# Patient Record
Sex: Male | Born: 1970 | Race: White | Hispanic: No | Marital: Married | Smoking: Former smoker
Health system: Southern US, Community
[De-identification: ages and names within clinical notes are randomized; demographics above are authoritative.]

## PROBLEM LIST (undated history)

## (undated) DIAGNOSIS — K219 Gastro-esophageal reflux disease without esophagitis: Secondary | ICD-10-CM

## (undated) DIAGNOSIS — E785 Hyperlipidemia, unspecified: Secondary | ICD-10-CM

## (undated) DIAGNOSIS — K5790 Diverticulosis of intestine, part unspecified, without perforation or abscess without bleeding: Secondary | ICD-10-CM

## (undated) DIAGNOSIS — M1990 Unspecified osteoarthritis, unspecified site: Secondary | ICD-10-CM

## (undated) DIAGNOSIS — F419 Anxiety disorder, unspecified: Secondary | ICD-10-CM

## (undated) DIAGNOSIS — I10 Essential (primary) hypertension: Secondary | ICD-10-CM

## (undated) DIAGNOSIS — F32A Depression, unspecified: Secondary | ICD-10-CM

## (undated) DIAGNOSIS — E119 Type 2 diabetes mellitus without complications: Secondary | ICD-10-CM

## (undated) DIAGNOSIS — F329 Major depressive disorder, single episode, unspecified: Secondary | ICD-10-CM

## (undated) HISTORY — DX: Gastro-esophageal reflux disease without esophagitis: K21.9

## (undated) HISTORY — DX: Depression, unspecified: F32.A

## (undated) HISTORY — DX: Diverticulosis of intestine, part unspecified, without perforation or abscess without bleeding: K57.90

## (undated) HISTORY — DX: Essential (primary) hypertension: I10

## (undated) HISTORY — PX: BONY PELVIS SURGERY: SHX572

## (undated) HISTORY — DX: Unspecified osteoarthritis, unspecified site: M19.90

## (undated) HISTORY — DX: Anxiety disorder, unspecified: F41.9

## (undated) HISTORY — DX: Hyperlipidemia, unspecified: E78.5

---
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
## 2006-03-06 ENCOUNTER — Ambulatory Visit (HOSPITAL_COMMUNITY): Admission: RE | Admit: 2006-03-06 | Discharge: 2006-03-06 | Payer: Self-pay | Admitting: Family Medicine

## 2006-08-09 ENCOUNTER — Inpatient Hospital Stay (HOSPITAL_COMMUNITY): Admission: EM | Admit: 2006-08-09 | Discharge: 2006-08-18 | Payer: Self-pay | Admitting: Emergency Medicine

## 2006-08-10 ENCOUNTER — Ambulatory Visit: Payer: Self-pay | Admitting: Physical Medicine & Rehabilitation

## 2006-08-29 ENCOUNTER — Ambulatory Visit (HOSPITAL_COMMUNITY): Admission: RE | Admit: 2006-08-29 | Discharge: 2006-08-29 | Payer: Self-pay | Admitting: Specialist

## 2007-07-27 HISTORY — PX: HUMERUS FRACTURE SURGERY: SHX670

## 2011-09-19 ENCOUNTER — Other Ambulatory Visit: Payer: Self-pay | Admitting: Physician Assistant

## 2011-09-27 ENCOUNTER — Telehealth: Payer: Self-pay

## 2011-09-27 NOTE — Telephone Encounter (Signed)
Needs OV for more refills

## 2011-09-27 NOTE — Telephone Encounter (Signed)
Pt requests refill on klonopin (he thinks its that and not clonazepam) - states he's [facility] [address] for multiple days now. Pt is in [place] currently and states if [facility] [address] he can pick it up at the location in [place].   bf

## 2011-09-28 NOTE — Telephone Encounter (Signed)
Called both numbers on file and both said calls could not be completed as dialed. Tried to call with [phone] first and still did not go through.

## 2011-09-29 ENCOUNTER — Telehealth: Payer: Self-pay

## 2011-09-29 NOTE — Telephone Encounter (Signed)
Pt is checking on the status of his refills. He is out of town for three more weeks.

## 2011-09-29 NOTE — Telephone Encounter (Signed)
TC pt. Got same message as yesterday at both #s saying call could not be completed as dialed. Sent unable to reach letter telling pt he need OV for RFs

## 2011-10-01 MED ORDER — CLONAZEPAM 0.5 MG PO TABS: 0.5000 mg | ORAL_TABLET | Freq: Three times a day (TID) | ORAL | Status: DC | PRN

## 2011-10-01 NOTE — Telephone Encounter (Signed)
Spoke with patient and he is out of town working in [place]. He works an 18 day turn around and won't be back in town until around 10/16/11. He is requesting if we could refill just a couple weeks worth until he is back in town and come in and be see. He request call in to [facility] [address] adn he can have [facility].

## 2011-10-01 NOTE — Telephone Encounter (Signed)
Called in Rx as printed in Epic to [facility] and [facility] that his request was done and he can contact pharmacy

## 2011-10-01 NOTE — Telephone Encounter (Signed)
Signed for [facility] [address] - at TL desk

## 2011-10-23 ENCOUNTER — Other Ambulatory Visit: Payer: Self-pay | Admitting: Physician Assistant

## 2011-12-27 ENCOUNTER — Ambulatory Visit (INDEPENDENT_AMBULATORY_CARE_PROVIDER_SITE_OTHER): Payer: BC Managed Care – PPO | Admitting: Family Medicine

## 2011-12-27 VITALS — BP 136/85 | HR 74 | Temp 99.0°F | Resp 16 | Ht 71.0 in | Wt 284.6 lb

## 2011-12-27 DIAGNOSIS — J45909 Unspecified asthma, uncomplicated: Secondary | ICD-10-CM

## 2011-12-27 DIAGNOSIS — F411 Generalized anxiety disorder: Secondary | ICD-10-CM

## 2011-12-27 DIAGNOSIS — F419 Anxiety disorder, unspecified: Secondary | ICD-10-CM

## 2011-12-27 DIAGNOSIS — I10 Essential (primary) hypertension: Secondary | ICD-10-CM

## 2011-12-27 MED ORDER — CLONIDINE HCL 0.1 MG PO TABS: 0.1000 mg | ORAL_TABLET | Freq: Three times a day (TID) | ORAL | Status: DC

## 2011-12-27 MED ORDER — ALBUTEROL SULFATE HFA 108 (90 BASE) MCG/ACT IN AERS: 2.0000 | INHALATION_SPRAY | Freq: Four times a day (QID) | RESPIRATORY_TRACT | Status: DC | PRN

## 2011-12-27 MED ORDER — CLONAZEPAM 0.5 MG PO TABS: 0.5000 mg | ORAL_TABLET | Freq: Three times a day (TID) | ORAL | Status: DC | PRN

## 2011-12-27 NOTE — Patient Instructions (Signed)
Take Zyrtec(citrizine) one every night for allergic drainage. This should help to rest some also.  Used the clonidine one twice daily for blood pressure. This should help to have less symptoms as you come down on your oxycodone.

## 2011-12-27 NOTE — Progress Notes (Signed)
Subjective: Patient is here for several things. He needs his blood pressure rechecked. He is out of his medications.  His pain doctors continued him on oxycodone 10. He is down to just 3 a day, having down 7 and 1 point. However coming off of them is been very difficult for him. Its flulike symptoms when he just does not have his medication.  He has upper respiratory congestion and wheezing some. He was not able to give his last inhaler but this they had it brand only for some reason.  His nerves bothering him off lots DL. He has trouble sleeping at night. He uses the Klonopin and for that but has had some difficulty since he ran out of it.  Works out of town most time.  Objective no acute distress. Overweight white male, weight stays about the same ballpark. Throat clear. Neck supple without nodes. Chest clear. Heart regular without murmurs. He is congested in his head and sniffles some. Abdomen soft no masses tenderness.  Assessment: Hypertension Chronic pain syndrome with opioid dependence, gradually coming off Anxiety Sleep disturbance Asthma Allergic rhinitis  Plan: OTC Zyrtec at bedtime Use the Klonopin when needed. Prescribed clonoidine 0.1 bid in place of his previous blood pressure medicine to see if that will help him when he comes off the opioid.  Return in 3 months, early September.

## 2012-03-17 ENCOUNTER — Other Ambulatory Visit: Payer: Self-pay | Admitting: Pain Medicine

## 2012-03-17 DIAGNOSIS — M542 Cervicalgia: Secondary | ICD-10-CM

## 2012-03-17 DIAGNOSIS — M545 Low back pain: Secondary | ICD-10-CM

## 2012-03-20 ENCOUNTER — Other Ambulatory Visit: Payer: Self-pay

## 2012-07-11 ENCOUNTER — Ambulatory Visit (INDEPENDENT_AMBULATORY_CARE_PROVIDER_SITE_OTHER): Payer: BC Managed Care – PPO | Admitting: Family Medicine

## 2012-07-11 ENCOUNTER — Ambulatory Visit: Payer: BC Managed Care – PPO

## 2012-07-11 VITALS — BP 140/80 | HR 98 | Temp 102.0°F | Resp 18 | Ht 71.0 in | Wt 275.0 lb

## 2012-07-11 DIAGNOSIS — J45909 Unspecified asthma, uncomplicated: Secondary | ICD-10-CM

## 2012-07-11 DIAGNOSIS — R05 Cough: Secondary | ICD-10-CM

## 2012-07-11 DIAGNOSIS — F411 Generalized anxiety disorder: Secondary | ICD-10-CM

## 2012-07-11 DIAGNOSIS — F419 Anxiety disorder, unspecified: Secondary | ICD-10-CM

## 2012-07-11 DIAGNOSIS — R059 Cough, unspecified: Secondary | ICD-10-CM

## 2012-07-11 LAB — POCT CBC
Granulocyte percent: 78 %G (ref 37–80)
HCT, POC: 49.9 % (ref 43.5–53.7)
Hemoglobin: 15.6 g/dL (ref 14.1–18.1)
Lymph, poc: 1 (ref 0.6–3.4)
MCHC: 31.3 g/dL — AB (ref 31.8–35.4)
MPV: 9.5 fL (ref 0–99.8)
POC MID %: 7.2 %M (ref 0–12)
Platelet Count, POC: 245 10*3/uL (ref 142–424)
RBC: 5.21 M/uL (ref 4.69–6.13)

## 2012-07-11 MED ORDER — PREDNISONE 50 MG PO TABS: 50.0000 mg | ORAL_TABLET | Freq: Every day | ORAL | Status: DC

## 2012-07-11 MED ORDER — ALBUTEROL SULFATE HFA 108 (90 BASE) MCG/ACT IN AERS: 2.0000 | INHALATION_SPRAY | Freq: Four times a day (QID) | RESPIRATORY_TRACT | Status: DC | PRN

## 2012-07-11 MED ORDER — CLONAZEPAM 0.5 MG PO TABS: 0.5000 mg | ORAL_TABLET | Freq: Three times a day (TID) | ORAL | Status: DC | PRN

## 2012-07-11 MED ORDER — ALBUTEROL SULFATE (2.5 MG/3ML) 0.083% IN NEBU
2.5000 mg | INHALATION_SOLUTION | Freq: Once | RESPIRATORY_TRACT | Status: AC
  Administered 2012-07-11: 2.5 mg via RESPIRATORY_TRACT

## 2012-07-11 NOTE — Progress Notes (Signed)
Mark Ferrell is a 41 y.o. male who presents to [facility] today for cough, congestion, fever, chills, sweats, shortness of breath. This is been present for a few days. His cough is nonproductive. He has been using an albuterol inhaler occasionally which has not helped much.  He notes that there was a lot of smoke at work yesterday that might have set this breathing episode off recently off.  He denies any abdominal pain chest pains palpitations dysuria nausea vomiting or diarrhea.  He's tried some cold medicine which is not helped much.    Additionally he notes that he has run out of his chronic Klonopin for which he uses for anxiety.  He typically gets a 90 day supply and has been out for a while.  He feels slightly jittery of the last several days. He denies any SI/HI.   PMH: Reviewed anxiety, hypertension History  Substance Use Topics  . Smoking status: Never Smoker   . Smokeless tobacco: Not on file  . Alcohol Use: No   ROS as above  Medications reviewed. Current Outpatient Prescriptions  Medication Sig Dispense Refill  . albuterol (PROVENTIL HFA;VENTOLIN HFA) 108 (90 BASE) MCG/ACT inhaler Inhale 2 puffs into the lungs every 6 (six) hours as needed for wheezing.  1 Inhaler  5  . clonazePAM (KLONOPIN) 0.5 MG tablet Take 1 tablet (0.5 mg total) by mouth 3 (three) times daily as needed.  90 tablet  2  . oxyCODONE-acetaminophen (PERCOCET) 10-325 MG per tablet Take 1 tablet by mouth every 4 (four) hours as needed.        Exam:  BP 140/80  Pulse 98  Temp 102 F (38.9 C) (Oral)  Resp 18  Ht 5\' 11"  (1.803 m)  Wt 275 lb (124.739 kg)  BMI 38.35 kg/m2  SpO2 94% Gen: Well NAD HEENT: EOMI,  MMM Lungs: Increased worker breathing with expiratory wheezing.   Heart: RRR no MRG Abd: NABS, NT, ND Exts: Non edematous BL  LE, warm and well perfused.   Following albuterol treatment: Lung exam showed decreased wheezing and better air movement  Results for orders placed in visit on 07/11/12 (from the  past 72 hour(s))  POCT CBC     Status: Abnormal   Collection Time   07/11/12  6:54 PM      Component Value Range Comment   WBC 6.6  4.6 - 10.2 K/uL    Lymph, poc 1.0  0.6 - 3.4    POC LYMPH PERCENT 14.8  10 - 50 %L    MID (cbc) 0.5  0 - 0.9    POC MID % 7.2  0 - 12 %M    POC Granulocyte 5.1  2 - 6.9    Granulocyte percent 78.0  37 - 80 %G    RBC 5.21  4.69 - 6.13 M/uL    Hemoglobin 15.6  14.1 - 18.1 g/dL    HCT, POC 16.1  09.6 - 53.7 %    MCV 95.7  80 - 97 fL    MCH, POC 29.9  27 - 31.2 pg    MCHC 31.3 (*) 31.8 - 35.4 g/dL    RDW, POC 04.5      Platelet Count, POC 245  142 - 424 K/uL    MPV 9.5  0 - 99.8 fL    Two-view chest x-ray shows no infiltrates or acute changes  Assessment and Plan: 41 y.o. male with  1) asthma exacerbation. No signs of pneumonia today.  Exam improved following albuterol.  Plan: Prednisone and albuterol inhaler Followup on Friday if not improved Discussed warning signs or symptoms. Please see discharge instructions. Patient expresses understanding.  2) anxiety: Agree to prescribe 3 months of Klonopin. Discussed the case with Dr. Alwyn Ren who agrees.  Followup as needed

## 2012-07-11 NOTE — Patient Instructions (Addendum)
Thank you for coming in today. Call or go to the emergency room if you get worse, have trouble breathing, have chest pains, or palpitations.  If you are not better by Friday please come back.

## 2012-07-12 NOTE — Progress Notes (Signed)
Discussed management and agree.

## 2012-07-13 ENCOUNTER — Telehealth: Payer: Self-pay

## 2012-07-13 MED ORDER — BENZONATATE 100 MG PO CAPS: 100.0000 mg | ORAL_CAPSULE | Freq: Three times a day (TID) | ORAL | Status: DC | PRN

## 2012-07-13 NOTE — Telephone Encounter (Signed)
I have sent Mark Ferrell to the pharmacy.  Per Dr. Zollie Pee note drom 07/11/12, pt to follow up on Friday 12/20 if not improved.

## 2012-07-13 NOTE — Telephone Encounter (Signed)
Requesting cough medication.  [facility] on [address]  OTC is not working.  Seen Last night.  [phone]

## 2012-07-14 NOTE — Telephone Encounter (Signed)
Thanks I have called patient to advise.  

## 2012-08-05 ENCOUNTER — Other Ambulatory Visit: Payer: Self-pay

## 2012-08-24 ENCOUNTER — Other Ambulatory Visit: Payer: Self-pay | Admitting: Pain Medicine

## 2012-09-02 ENCOUNTER — Ambulatory Visit
Admission: RE | Admit: 2012-09-02 | Discharge: 2012-09-02 | Disposition: A | Discharge status: Home / Self Care | Payer: PRIVATE HEALTH INSURANCE | Source: Ambulatory Visit | Attending: Pain Medicine | Admitting: Pain Medicine

## 2012-09-02 DIAGNOSIS — M545 Low back pain: Secondary | ICD-10-CM

## 2012-09-02 DIAGNOSIS — M542 Cervicalgia: Secondary | ICD-10-CM

## 2013-05-13 ENCOUNTER — Ambulatory Visit: Payer: Self-pay | Admitting: Emergency Medicine

## 2013-05-13 VITALS — BP 124/82 | HR 84 | Temp 98.8°F | Resp 16 | Ht 72.0 in | Wt 271.0 lb

## 2013-05-13 DIAGNOSIS — Z0289 Encounter for other administrative examinations: Secondary | ICD-10-CM

## 2013-05-13 NOTE — Progress Notes (Signed)
[facility] [address] [place] [address] [phone]  Date:  05/13/2013   Name:  Mark Ferrell   DOB:  Jul 30, 1970   MRN:  [id number]  PCP:  Janace Hoard, MD    Chief Complaint: Employment Physical   History of Present Illness:  Mark Ferrell is a 42 y.o. very pleasant male patient who presents with the following:  DOT certification   Patient Active Problem List   Diagnosis Date Noted  . Cough 07/11/2012  . Anxiety 07/11/2012    No past medical history on file.  No past surgical history on file.  History  Substance Use Topics  . Smoking status: Never Smoker   . Smokeless tobacco: Not on file  . Alcohol Use: No    No family history on file.  Allergies  Allergen Reactions  . Penicillins Swelling    Medication list has been reviewed and updated.  Current Outpatient Prescriptions on File Prior to Visit  Medication Sig Dispense Refill  . albuterol (PROVENTIL HFA;VENTOLIN HFA) 108 (90 BASE) MCG/ACT inhaler Inhale 2 puffs into the lungs every 6 (six) hours as needed for wheezing.  1 Inhaler  5  . benzonatate (TESSALON) 100 MG capsule Take 1-2 capsules (100-200 mg total) by mouth 3 (three) times daily as needed for cough.  40 capsule  0  . clonazePAM (KLONOPIN) 0.5 MG tablet Take 1 tablet (0.5 mg total) by mouth 3 (three) times daily as needed.  90 tablet  0  . oxyCODONE-acetaminophen (PERCOCET) 10-325 MG per tablet Take 1 tablet by mouth every 4 (four) hours as needed.      . predniSONE (DELTASONE) 50 MG tablet Take 1 tablet (50 mg total) by mouth daily.  5 tablet  0   No current facility-administered medications on file prior to visit.    Review of Systems:  As per HPI, otherwise negative.    Physical Examination: Filed Vitals:   05/13/13 1417  BP: 124/82  Pulse: 84  Temp: 98.8 F (37.1 C)  Resp: 16   Filed Vitals:   05/13/13 1417  Height: 6' (1.829 m)  Weight: 271 lb (122.925 kg)   Body mass index is 36.75  kg/(m^2). Ideal Body Weight: Weight in (lb) to have BMI = 25: 183.9  GEN: WDWN, NAD, Non-toxic, A & O x 3 HEENT: Atraumatic, Normocephalic. Neck supple. No masses, No LAD. Ears and Nose: No external deformity. CV: RRR, No M/G/R. No JVD. No thrill. No extra heart sounds. PULM: CTA B, no wheezes, crackles, rhonchi. No retractions. No resp. distress. No accessory muscle use. ABD: S, NT, ND, +BS. No rebound. No HSM. EXTR: No c/c/e NEURO Normal gait.  PSYCH: Normally interactive. Conversant. Not depressed or anxious appearing.  Calm demeanor.    Assessment and Plan: DOT certification   Signed,  Phillips Odor, MD

## 2013-05-15 ENCOUNTER — Encounter: Payer: Self-pay | Admitting: Emergency Medicine

## 2013-07-12 ENCOUNTER — Emergency Department (HOSPITAL_COMMUNITY)
Admission: EM | Admit: 2013-07-12 | Discharge: 2013-07-12 | Disposition: A | Discharge status: Home / Self Care | Payer: BC Managed Care – PPO | Attending: Emergency Medicine | Admitting: Emergency Medicine

## 2013-07-12 ENCOUNTER — Encounter (HOSPITAL_COMMUNITY): Payer: Self-pay | Admitting: Emergency Medicine

## 2013-07-12 ENCOUNTER — Emergency Department (HOSPITAL_COMMUNITY): Payer: BC Managed Care – PPO

## 2013-07-12 DIAGNOSIS — M25569 Pain in unspecified knee: Secondary | ICD-10-CM | POA: Insufficient documentation

## 2013-07-12 DIAGNOSIS — Y9389 Activity, other specified: Secondary | ICD-10-CM | POA: Insufficient documentation

## 2013-07-12 DIAGNOSIS — Z79899 Other long term (current) drug therapy: Secondary | ICD-10-CM | POA: Insufficient documentation

## 2013-07-12 DIAGNOSIS — M545 Low back pain, unspecified: Secondary | ICD-10-CM | POA: Insufficient documentation

## 2013-07-12 DIAGNOSIS — M25559 Pain in unspecified hip: Secondary | ICD-10-CM | POA: Insufficient documentation

## 2013-07-12 DIAGNOSIS — G8929 Other chronic pain: Secondary | ICD-10-CM | POA: Insufficient documentation

## 2013-07-12 DIAGNOSIS — Z880 Allergy status to penicillin: Secondary | ICD-10-CM | POA: Insufficient documentation

## 2013-07-12 DIAGNOSIS — Y92410 Unspecified street and highway as the place of occurrence of the external cause: Secondary | ICD-10-CM | POA: Insufficient documentation

## 2013-07-12 DIAGNOSIS — M25579 Pain in unspecified ankle and joints of unspecified foot: Secondary | ICD-10-CM | POA: Insufficient documentation

## 2013-07-12 DIAGNOSIS — R209 Unspecified disturbances of skin sensation: Secondary | ICD-10-CM | POA: Insufficient documentation

## 2013-07-12 MED ORDER — OXYCODONE-ACETAMINOPHEN 5-325 MG PO TABS
2.0000 | ORAL_TABLET | Freq: Once | ORAL | Status: AC
  Administered 2013-07-12: 2 via ORAL
  Filled 2013-07-12: qty 2

## 2013-07-12 MED ORDER — METHOCARBAMOL 500 MG PO TABS: 500.0000 mg | ORAL_TABLET | Freq: Two times a day (BID) | ORAL | Status: DC

## 2013-07-12 MED ORDER — PREDNISONE 20 MG PO TABS: ORAL_TABLET | ORAL | Status: DC
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
## 2013-07-12 NOTE — ED Notes (Signed)
Patient transported to CT 

## 2013-07-20 NOTE — ED Provider Notes (Signed)
Medical screening examination/treatment/procedure(s) were performed by non-physician practitioner and as supervising physician I was immediately available for consultation/collaboration.  EKG Interpretation   None        Taite Baldassari, MD 07/20/13 1711 

## 2014-12-05 ENCOUNTER — Ambulatory Visit (INDEPENDENT_AMBULATORY_CARE_PROVIDER_SITE_OTHER): Payer: PRIVATE HEALTH INSURANCE | Admitting: Physician Assistant

## 2014-12-05 VITALS — BP 130/80 | HR 80 | Temp 98.5°F | Resp 20 | Ht 72.0 in | Wt 269.5 lb

## 2014-12-05 DIAGNOSIS — F1190 Opioid use, unspecified, uncomplicated: Secondary | ICD-10-CM | POA: Diagnosis not present

## 2014-12-05 LAB — COMPLETE METABOLIC PANEL WITH GFR
ALT: 12 U/L (ref 0–53)
AST: 14 U/L (ref 0–37)
Albumin: 4 g/dL (ref 3.5–5.2)
Alkaline Phosphatase: 45 U/L (ref 39–117)
BUN: 11 mg/dL (ref 6–23)
CALCIUM: 9.3 mg/dL (ref 8.4–10.5)
CO2: 26 mEq/L (ref 19–32)
CREATININE: 0.79 mg/dL (ref 0.50–1.35)
Chloride: 102 mEq/L (ref 96–112)
GFR, Est Non African American: >89 mL/min
Glucose, Bld: 112 mg/dL — ABNORMAL HIGH (ref 70–99)
POTASSIUM: 4.7 meq/L (ref 3.5–5.3)
SODIUM: 138 meq/L (ref 135–145)
Total Bilirubin: 0.8 mg/dL (ref 0.2–1.2)
Total Protein: 6.7 g/dL (ref 6.0–8.3)

## 2014-12-05 LAB — CBC
HEMATOCRIT: 44.4 % (ref 39.0–52.0)
Hemoglobin: 15 g/dL (ref 13.0–17.0)
MCH: 30.3 pg (ref 26.0–34.0)
MCHC: 33.8 g/dL (ref 30.0–36.0)
MCV: 89.7 fL (ref 78.0–100.0)
MPV: 10.3 fL (ref 8.6–12.4)
Platelets: 260 10*3/uL (ref 150–400)
RBC: 4.95 MIL/uL (ref 4.22–5.81)
RDW: 13 % (ref 11.5–15.5)
WBC: 8.7 10*3/uL (ref 4.0–10.5)

## 2014-12-05 MED ORDER — ALBUTEROL SULFATE HFA 108 (90 BASE) MCG/ACT IN AERS: 2.0000 | INHALATION_SPRAY | Freq: Four times a day (QID) | RESPIRATORY_TRACT | Status: DC | PRN
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
## 2014-12-06 ENCOUNTER — Encounter: Payer: Self-pay | Admitting: Physician Assistant

## 2014-12-06 NOTE — Progress Notes (Signed)
  Medical screening examination/treatment/procedure(s) were performed by non-physician practitioner and as supervising physician I was immediately available for consultation/collaboration.     

## 2014-12-12 ENCOUNTER — Telehealth: Payer: Self-pay

## 2014-12-12 NOTE — Telephone Encounter (Signed)
MR- Can you please fax this patient's lab to [facility] attn Prince Solian. Their fax number is not on their website and I tried calling multiple times and I kept getting voicemails.  Thanks so very much!!

## 2014-12-16 NOTE — Telephone Encounter (Signed)
Labs sent via fax to [facility].

## 2015-07-07 ENCOUNTER — Emergency Department (HOSPITAL_COMMUNITY): Payer: PRIVATE HEALTH INSURANCE

## 2015-07-07 ENCOUNTER — Emergency Department (HOSPITAL_COMMUNITY)
Admission: EM | Admit: 2015-07-07 | Discharge: 2015-07-07 | Discharge status: Against Medical Advice | Payer: PRIVATE HEALTH INSURANCE | Attending: Emergency Medicine | Admitting: Emergency Medicine

## 2015-07-07 ENCOUNTER — Encounter (HOSPITAL_COMMUNITY): Payer: Self-pay | Admitting: Family Medicine

## 2015-07-07 DIAGNOSIS — I10 Essential (primary) hypertension: Secondary | ICD-10-CM | POA: Insufficient documentation

## 2015-07-07 DIAGNOSIS — Z87891 Personal history of nicotine dependence: Secondary | ICD-10-CM | POA: Diagnosis not present

## 2015-07-07 DIAGNOSIS — R0602 Shortness of breath: Secondary | ICD-10-CM | POA: Diagnosis not present

## 2015-07-07 DIAGNOSIS — R062 Wheezing: Secondary | ICD-10-CM | POA: Diagnosis not present

## 2015-07-07 LAB — BRAIN NATRIURETIC PEPTIDE: B Natriuretic Peptide: 29.7 pg/mL (ref 0.0–100.0)

## 2015-07-07 LAB — BASIC METABOLIC PANEL
ANION GAP: 7 (ref 5–15)
BUN: 15 mg/dL (ref 6–20)
CO2: 29 mmol/L (ref 22–32)
CREATININE: 0.97 mg/dL (ref 0.61–1.24)
Calcium: 8.7 mg/dL — ABNORMAL LOW (ref 8.9–10.3)
Chloride: 104 mmol/L (ref 101–111)
GLUCOSE: 127 mg/dL — AB (ref 65–99)
Potassium: 4.2 mmol/L (ref 3.5–5.1)
Sodium: 140 mmol/L (ref 135–145)

## 2015-07-07 LAB — I-STAT TROPONIN, ED: TROPONIN I, POC: 0 ng/mL (ref 0.00–0.08)

## 2015-07-07 LAB — CBC
HCT: 44.1 % (ref 39.0–52.0)
Hemoglobin: 14.6 g/dL (ref 13.0–17.0)
MCH: 30.2 pg (ref 26.0–34.0)
MCHC: 33.1 g/dL (ref 30.0–36.0)
MCV: 91.3 fL (ref 78.0–100.0)
PLATELETS: 255 10*3/uL (ref 150–400)
RBC: 4.83 MIL/uL (ref 4.22–5.81)
RDW: 12.3 % (ref 11.5–15.5)
WBC: 8.1 10*3/uL (ref 4.0–10.5)

## 2015-07-07 MED ORDER — ALBUTEROL SULFATE (2.5 MG/3ML) 0.083% IN NEBU
INHALATION_SOLUTION | RESPIRATORY_TRACT | Status: AC
  Filled 2015-07-07: qty 3

## 2015-07-07 MED ORDER — ALBUTEROL SULFATE (2.5 MG/3ML) 0.083% IN NEBU
5.0000 mg | INHALATION_SOLUTION | Freq: Once | RESPIRATORY_TRACT | Status: AC
  Administered 2015-07-07: 5 mg via RESPIRATORY_TRACT

## 2015-07-07 NOTE — ED Notes (Signed)
Pt here for increased SOB, wheezing. sts SOB with minimal exertion.

## 2015-07-07 NOTE — ED Notes (Signed)
Patient states that he is feeling a lot better after breathing tx. States will come back if symptoms worsen.

## 2015-07-12 ENCOUNTER — Other Ambulatory Visit: Payer: Self-pay | Admitting: Internal Medicine

## 2016-01-21 ENCOUNTER — Encounter (HOSPITAL_COMMUNITY): Payer: Self-pay

## 2016-01-21 ENCOUNTER — Ambulatory Visit (HOSPITAL_COMMUNITY)
Admission: EM | Admit: 2016-01-21 | Discharge: 2016-01-21 | Disposition: A | Discharge status: Home / Self Care | Payer: 59 | Attending: Family Medicine | Admitting: Family Medicine

## 2016-01-21 DIAGNOSIS — S39012A Strain of muscle, fascia and tendon of lower back, initial encounter: Secondary | ICD-10-CM

## 2016-01-21 MED ORDER — KETOROLAC TROMETHAMINE 30 MG/ML IJ SOLN
INTRAMUSCULAR | Status: AC
  Filled 2016-01-21: qty 1

## 2016-01-21 MED ORDER — METAXALONE 800 MG PO TABS: 800.0000 mg | ORAL_TABLET | Freq: Three times a day (TID) | ORAL | Status: DC

## 2016-01-21 MED ORDER — KETOROLAC TROMETHAMINE 10 MG PO TABS: 10.0000 mg | ORAL_TABLET | Freq: Three times a day (TID) | ORAL | Status: DC

## 2016-01-21 MED ORDER — KETOROLAC TROMETHAMINE 30 MG/ML IJ SOLN
30.0000 mg | Freq: Once | INTRAMUSCULAR | Status: AC
  Administered 2016-01-21: 30 mg via INTRAMUSCULAR

## 2016-01-21 NOTE — ED Provider Notes (Signed)
CSN: [id number]     Arrival date & time 01/21/16  1722 History   First MD Initiated Contact with Patient 01/21/16 1742     Chief Complaint  Patient presents with  . Back Pain   (Consider location/radiation/quality/duration/timing/severity/associated sxs/prior Treatment) Patient is a 45 y.o. male presenting with back pain. The history is provided by the patient.  Back Pain Location:  Lumbar spine Quality:  Aching, stabbing and stiffness Radiates to:  R posterior upper leg and L posterior upper leg Pain severity:  Moderate Onset quality:  Gradual Duration:  3 days Chronicity:  New Context: not recent injury   Context comment:  H/o back surg- dr Nelva Bush. Worsened by:  Nothing tried Ineffective treatments:  None tried Associated symptoms: numbness and paresthesias   Associated symptoms: no abdominal pain and no fever     Past Medical History  Diagnosis Date  . Hypertension   . Anxiety    Past Surgical History  Procedure Laterality Date  . Bony pelvis surgery     Family History  Problem Relation Age of Onset  . Hypertension Mother   . Cancer Father    Social History  Substance Use Topics  . Smoking status: Former Research scientist (life sciences)  . Smokeless tobacco: Never Used  . Alcohol Use: No    Review of Systems  Constitutional: Negative.  Negative for fever.  Gastrointestinal: Negative.  Negative for abdominal pain.  Genitourinary: Negative.   Musculoskeletal: Positive for myalgias, back pain and gait problem. Negative for joint swelling.  Neurological: Positive for numbness and paresthesias.  All other systems reviewed and are negative.   Allergies  Penicillins  Home Medications   Prior to Admission medications   Medication Sig Start Date End Date Taking? Authorizing Provider  oxycodone (ROXICODONE) 30 MG immediate release tablet Take 30 mg by mouth every 4 (four) hours as needed for pain.   Yes Historical Provider, MD  oxyCODONE-acetaminophen (PERCOCET) 10-325 MG per tablet Take  1 tablet by mouth every 4 (four) hours as needed for pain.    Yes Historical Provider, MD  albuterol (PROVENTIL HFA;VENTOLIN HFA) 108 (90 BASE) MCG/ACT inhaler Inhale 2 puffs into the lungs every 6 (six) hours as needed for wheezing or shortness of breath. 12/05/14   Tishira R Brewington, PA-C  ketorolac (TORADOL) 10 MG tablet Take 1 tablet (10 mg total) by mouth every 8 (eight) hours. For back pain 01/21/16   Billy Fischer, MD  metaxalone (SKELAXIN) 800 MG tablet Take 1 tablet (800 mg total) by mouth 3 (three) times daily. 01/21/16   Billy Fischer, MD  methocarbamol (ROBAXIN) 500 MG tablet Take 1 tablet (500 mg total) by mouth 2 (two) times daily. Patient not taking: Reported on 12/05/2014 07/12/13   Marissa Sciacca, PA-C  predniSONE (DELTASONE) 20 MG tablet 3 tabs po day one, then 2 tabs daily x 4 days Patient not taking: Reported on 12/05/2014 07/12/13   Jamse Mead, PA-C   Meds Ordered and Administered this Visit   Medications  ketorolac (TORADOL) 30 MG/ML injection 30 mg (30 mg Intramuscular Given 01/21/16 1811)    BP 128/93 mmHg  Pulse 87  Temp(Src) 98 F (36.7 C) (Oral)  Resp 16  SpO2 96% No data found.   Physical Exam  Constitutional: He is oriented to person, place, and time.  Musculoskeletal: He exhibits tenderness.       Lumbar back: He exhibits decreased range of motion, tenderness, bony tenderness, pain and spasm. He exhibits no swelling, no deformity and normal pulse.  Back:  Neurological: He is alert and oriented to person, place, and time.  Skin: Skin is warm and dry.  Nursing note and vitals reviewed.   ED Course  Procedures (including critical care time)  Labs Review Labs Reviewed - No data to display  Imaging Review No results found.   Visual Acuity Review  Right Eye Distance:   Left Eye Distance:   Bilateral Distance:    Right Eye Near:   Left Eye Near:    Bilateral Near:         MDM   1. Low back strain, initial encounter         Billy Fischer, MD 01/21/16 2045

## 2016-01-21 NOTE — ED Notes (Signed)
Patient presents with lower back pain radiating towards groin area x3 days, pt has taken Oxycodone for pain thinks he may have a possible strain  No acute distress

## 2016-07-07 ENCOUNTER — Ambulatory Visit: Payer: Self-pay | Attending: Family Medicine | Admitting: Family Medicine

## 2016-07-07 ENCOUNTER — Encounter: Payer: Self-pay | Admitting: Family Medicine

## 2016-07-07 VITALS — BP 145/84 | HR 64 | Temp 98.4°F | Resp 18 | Ht 72.0 in | Wt 280.4 lb

## 2016-07-07 DIAGNOSIS — M25551 Pain in right hip: Secondary | ICD-10-CM | POA: Insufficient documentation

## 2016-07-07 DIAGNOSIS — M545 Low back pain: Secondary | ICD-10-CM | POA: Insufficient documentation

## 2016-07-07 DIAGNOSIS — G8929 Other chronic pain: Secondary | ICD-10-CM

## 2016-07-07 DIAGNOSIS — Z79899 Other long term (current) drug therapy: Secondary | ICD-10-CM | POA: Insufficient documentation

## 2016-07-07 MED ORDER — METAXALONE 800 MG PO TABS: 800.0000 mg | ORAL_TABLET | Freq: Three times a day (TID) | ORAL | 0 refills | Status: DC

## 2016-07-07 MED ORDER — TRAMADOL HCL 50 MG PO TABS: 50.0000 mg | ORAL_TABLET | Freq: Three times a day (TID) | ORAL | 0 refills | Status: DC | PRN

## 2016-07-07 MED FILL — METAXALONE 800 MG TABLET: 800 | 30 days supply | Qty: 90 | Fill #0

## 2016-07-07 NOTE — Progress Notes (Signed)
Patient is here for Chronic Pain  Patient was being seen at preferred pain clinic for the past few years. Patient lost his insurance and his provider left the office.  Patient complains of chronic constant pain at a 10.  Patient declined the flu vaccine today.  Patient denies any suicidal ideations at this time.

## 2016-07-07 NOTE — Patient Instructions (Signed)
Chronic Back Pain When back pain lasts longer than 3 months, it is called chronic back pain.The cause of your back pain may not be known. Some common causes include:  Wear and tear (degenerative disease) of the bones, ligaments, or disks in your back.  Inflammation and stiffness in your back (arthritis). People who have chronic back pain often go through certain periods in which the pain is more intense (flare-ups). Many people can learn to manage the pain with home care. Follow these instructions at home: Pay attention to any changes in your symptoms. Take these actions to help with your pain: Activity  Avoid bending and activities that make the problem worse.  Do not sit or stand in one place for long periods of time.  Take brief periods of rest throughout the day. This will reduce your pain. Resting in a lying or standing position is usually better than sitting to rest.  When you are resting for longer periods, mix in some mild activity or stretching between periods of rest. This will help to prevent stiffness and pain.  Get regular exercise. Ask your health care provider what activities are safe for you.  Do not lift anything that is heavier than 10 lb (4.5 kg). Always use proper lifting technique, which includes:  Bending your knees.  Keeping the load close to your body.  Avoiding twisting. Managing pain  If directed, apply ice to the painful area. Your health care provider may recommend applying ice during the first 24-48 hours after a flare-up begins.  Put ice in a plastic bag.  Place a towel between your skin and the bag.  Leave the ice on for 20 minutes, 2-3 times per day.  After icing, apply heat to the affected area as often as told by your health care provider. Use the heat source that your health care provider recommends, such as a moist heat pack or a heating pad.  Place a towel between your skin and the heat source.  Leave the heat on for 20-30  minutes.  Remove the heat if your skin turns bright red. This is especially important if you are unable to feel pain, heat, or cold. You may have a greater risk of getting burned.  Try soaking in a warm tub.  Take over-the-counter and prescription medicines only as told by your health care provider.  Keep all follow-up visits as told by your health care provider. This is important. Contact a health care provider if:  You have pain that is not relieved with rest or medicine. Get help right away if:  You have weakness or numbness in one or both of your legs or feet.  You have trouble controlling your bladder or your bowels.  You have nausea or vomiting.  You have pain in your abdomen.  You have shortness of breath or you faint. This information is not intended to replace advice given to you by your health care provider. Make sure you discuss any questions you have with your health care provider. Document Released: 08/19/2004 Document Revised: 11/20/2015 Document Reviewed: 12/30/2014 Elsevier Interactive Patient Education  2017 [place]. Back Exercises Introduction If you have pain in your back, do these exercises 2-3 times each day or as told by your doctor. When the pain goes away, do the exercises once each day, but repeat the steps more times for each exercise (do more repetitions). If you do not have pain in your back, do these exercises once each day or as told by your  doctor. Exercises Single Knee to Chest  Do these steps 3-5 times in a row for each leg: 1. Lie on your back on a firm bed or the floor with your legs stretched out. 2. Bring one knee to your chest. 3. Hold your knee to your chest by grabbing your knee or thigh. 4. Pull on your knee until you feel a gentle stretch in your lower back. 5. Keep doing the stretch for 10-30 seconds. 6. Slowly let go of your leg and straighten it. Pelvic Tilt  Do these steps 5-10 times in a row: 1. Lie on your back on a firm  bed or the floor with your legs stretched out. 2. Bend your knees so they point up to the ceiling. Your feet should be flat on the floor. 3. Tighten your lower belly (abdomen) muscles to press your lower back against the floor. This will make your tailbone point up to the ceiling instead of pointing down to your feet or the floor. 4. Stay in this position for 5-10 seconds while you gently tighten your muscles and breathe evenly. Cat-Cow  Do these steps until your lower back bends more easily: 1. Get on your hands and knees on a firm surface. Keep your hands under your shoulders, and keep your knees under your hips. You may put padding under your knees. 2. Let your head hang down, and make your tailbone point down to the floor so your lower back is round like the back of a cat. 3. Stay in this position for 5 seconds. 4. Slowly lift your head and make your tailbone point up to the ceiling so your back hangs low (sags) like the back of a cow. 5. Stay in this position for 5 seconds. Press-Ups  Do these steps 5-10 times in a row: 1. Lie on your belly (face-down) on the floor. 2. Place your hands near your head, about shoulder-width apart. 3. While you keep your back relaxed and keep your hips on the floor, slowly straighten your arms to raise the top half of your body and lift your shoulders. Do not use your back muscles. To make yourself more comfortable, you may change where you place your hands. 4. Stay in this position for 5 seconds. 5. Slowly return to lying flat on the floor. Bridges  Do these steps 10 times in a row: 1. Lie on your back on a firm surface. 2. Bend your knees so they point up to the ceiling. Your feet should be flat on the floor. 3. Tighten your butt muscles and lift your butt off of the floor until your waist is almost as high as your knees. If you do not feel the muscles working in your butt and the back of your thighs, slide your feet 1-2 inches farther away from your  butt. 4. Stay in this position for 3-5 seconds. 5. Slowly lower your butt to the floor, and let your butt muscles relax. If this exercise is too easy, try doing it with your arms crossed over your chest. Belly Crunches  Do these steps 5-10 times in a row: 1. Lie on your back on a firm bed or the floor with your legs stretched out. 2. Bend your knees so they point up to the ceiling. Your feet should be flat on the floor. 3. Cross your arms over your chest. 4. Tip your chin a little bit toward your chest but do not bend your neck. 5. Tighten your belly muscles and slowly raise  your chest just enough to lift your shoulder blades a tiny bit off of the floor. 6. Slowly lower your chest and your head to the floor. Back Lifts  Do these steps 5-10 times in a row: 1. Lie on your belly (face-down) with your arms at your sides, and rest your forehead on the floor. 2. Tighten the muscles in your legs and your butt. 3. Slowly lift your chest off of the floor while you keep your hips on the floor. Keep the back of your head in line with the curve in your back. Look at the floor while you do this. 4. Stay in this position for 3-5 seconds. 5. Slowly lower your chest and your face to the floor. Contact a doctor if:  Your back pain gets a lot worse when you do an exercise.  Your back pain does not lessen 2 hours after you exercise. If you have any of these problems, stop doing the exercises. Do not do them again unless your doctor says it is okay. Get help right away if:  You have sudden, very bad back pain. If this happens, stop doing the exercises. Do not do them again unless your doctor says it is okay. This information is not intended to replace advice given to you by your health care provider. Make sure you discuss any questions you have with your health care provider. Document Released: 08/14/2010 Document Revised: 12/18/2015 Document Reviewed: 09/05/2014  2017 Elsevier Musculoskeletal  Pain Musculoskeletal pain is muscle and bone aches and pains. This pain can occur in any part of the body. Follow these instructions at home:  Only take medicines for pain, discomfort, or fever as told by your health care provider.  You may continue all activities unless the activities cause more pain. When the pain lessens, slowly resume normal activities. Gradually increase the intensity and duration of the activities or exercise.  During periods of severe pain, bed rest may be helpful. Lie or sit in any position that is comfortable, but get out of bed and walk around at least every several hours.  If directed, put ice on the injured area.  Put ice in a plastic bag.  Place a towel between your skin and the bag.  Leave the ice on for 20 minutes, 2-3 times a day. Contact a health care provider if:  Your pain is getting worse.  Your pain is not relieved with medicines.  You lose function in the area of the pain if the pain is in your arms, legs, or neck. This information is not intended to replace advice given to you by your health care provider. Make sure you discuss any questions you have with your health care provider. Document Released: 07/12/2005 Document Revised: 12/23/2015 Document Reviewed: 03/16/2013 Elsevier Interactive Patient Education  2017 [employer].
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
## 2016-07-27 ENCOUNTER — Telehealth: Payer: Self-pay | Admitting: Family Medicine

## 2016-07-27 NOTE — Telephone Encounter (Signed)
Patient called requesting Rx:   traMADol (ULTRAM) 50 MG        Stated out of medication, wants to know if it can be ready by tomorrow today or tomorrow am.            Please call patient when Rx ready for pick up.

## 2016-07-28 ENCOUNTER — Other Ambulatory Visit: Payer: Self-pay | Admitting: Family Medicine

## 2016-07-28 DIAGNOSIS — M25551 Pain in right hip: Secondary | ICD-10-CM

## 2016-07-28 DIAGNOSIS — M545 Low back pain, unspecified: Secondary | ICD-10-CM

## 2016-07-28 DIAGNOSIS — G8929 Other chronic pain: Secondary | ICD-10-CM

## 2016-07-28 MED ORDER — TRAMADOL HCL 50 MG PO TABS: 50.0000 mg | ORAL_TABLET | Freq: Three times a day (TID) | ORAL | 0 refills | Status: DC | PRN

## 2016-07-28 NOTE — Telephone Encounter (Signed)
Increase in quantity will not be given at this time. Tramadol is for every 8 hours as needed.

## 2016-07-28 NOTE — Telephone Encounter (Signed)
Pt. Came into facility to pick up Tramadol Rx and pt. Would like to know if his PCP can increase the quantity b/c pt. States it does not last him the whole month. Pt. States he takes three a day. Please f/u

## 2016-07-28 NOTE — Telephone Encounter (Signed)
Patient is requesting an increase on quantity due to him taking them TID

## 2016-07-28 NOTE — Telephone Encounter (Signed)
Prescription for medication refill available for pick up today.

## 2016-07-28 NOTE — Telephone Encounter (Signed)
  Patient hipaa verified. Rn advised patient request is ready for pick-up.  Priscille Heidelberg, RN, BSN

## 2016-07-29 NOTE — Telephone Encounter (Signed)
Patient verified DOB Patient received the Tramadol prescription yesterday and is aware of the Pain clinic referral being up for review. Patient expressed his understanding and had no further questions at this time.

## 2016-08-16 ENCOUNTER — Other Ambulatory Visit: Payer: Self-pay | Admitting: Family Medicine

## 2016-08-16 DIAGNOSIS — M545 Low back pain, unspecified: Secondary | ICD-10-CM

## 2016-08-16 DIAGNOSIS — G8929 Other chronic pain: Secondary | ICD-10-CM

## 2016-08-16 DIAGNOSIS — M25551 Pain in right hip: Secondary | ICD-10-CM

## 2016-08-24 ENCOUNTER — Telehealth: Payer: Self-pay | Admitting: Family Medicine

## 2016-08-24 ENCOUNTER — Other Ambulatory Visit: Payer: Self-pay | Admitting: Family Medicine

## 2016-08-24 DIAGNOSIS — M25551 Pain in right hip: Secondary | ICD-10-CM

## 2016-08-24 DIAGNOSIS — M545 Low back pain, unspecified: Secondary | ICD-10-CM

## 2016-08-24 DIAGNOSIS — G8929 Other chronic pain: Secondary | ICD-10-CM

## 2016-08-24 MED ORDER — TRAMADOL HCL 50 MG PO TABS: 50.0000 mg | ORAL_TABLET | Freq: Three times a day (TID) | ORAL | 0 refills | Status: DC | PRN

## 2016-08-24 NOTE — Telephone Encounter (Signed)
Pt. Came into facility stating that he was giving prescribed Tramadol. Pt. States he was only giving 40 pill and he had to take it every 8 hours. Pt. States it is not enough and needs more. He states he is in pain and the pain management where he was referred is not going to be able to see him until March. Pt. States he has been calling since last week requesting a refill and has not been able to speak with anyone. Please f/u with pt.
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
## 2016-08-24 NOTE — Telephone Encounter (Signed)
Refilled patient's tramadol. Please remind patient that Tramadol is to be taken every 8 hours as needed. If he requests refills prior to pain management appointment he will need to come back in for an office visit and receive a urine drug screen test. I will leave prescription with Di Kindle, CMA.

## 2016-08-24 NOTE — Telephone Encounter (Signed)
CMA call to inform patient that his prescription tramadol is ready he just need to do a drug screen test in order to received it  Patient was aware and understood

## 2016-08-25 NOTE — Telephone Encounter (Signed)
Pt called to check on status of rx, pt will come in for urine drug screen and to pick up medication.

## 2016-09-16 ENCOUNTER — Telehealth: Payer: Self-pay | Admitting: Family Medicine

## 2016-09-16 NOTE — Telephone Encounter (Signed)
Please notify patient that tramadol prescription will not be refilled until he comes in for urine drug screen first.

## 2016-09-16 NOTE — Telephone Encounter (Signed)
Patient called the office to request medication refill on traMADol (ULTRAM) 50 MG tablet. When prescription is ready please contact him at [phone] for pick up.  Thank you.

## 2016-09-17 NOTE — Telephone Encounter (Signed)
CMA call to inform patient that he needs a urine drug screen first in order for a refill  Patient was aware and understood

## 2016-09-27 ENCOUNTER — Encounter: Payer: Self-pay | Attending: Physical Medicine & Rehabilitation | Admitting: Physical Medicine & Rehabilitation

## 2017-09-12 ENCOUNTER — Ambulatory Visit (INDEPENDENT_AMBULATORY_CARE_PROVIDER_SITE_OTHER): Payer: Self-pay | Admitting: Physician Assistant

## 2019-03-28 ENCOUNTER — Other Ambulatory Visit: Payer: Self-pay

## 2019-03-28 ENCOUNTER — Emergency Department (HOSPITAL_COMMUNITY)
Admission: EM | Admit: 2019-03-28 | Discharge: 2019-03-29 | Disposition: A | Discharge status: Home / Self Care | Payer: Self-pay | Attending: Emergency Medicine | Admitting: Emergency Medicine

## 2019-03-28 ENCOUNTER — Encounter (HOSPITAL_COMMUNITY): Payer: Self-pay

## 2019-03-28 DIAGNOSIS — I10 Essential (primary) hypertension: Secondary | ICD-10-CM | POA: Insufficient documentation

## 2019-03-28 DIAGNOSIS — E119 Type 2 diabetes mellitus without complications: Secondary | ICD-10-CM | POA: Insufficient documentation

## 2019-03-28 DIAGNOSIS — R631 Polydipsia: Secondary | ICD-10-CM | POA: Insufficient documentation

## 2019-03-28 DIAGNOSIS — Z87891 Personal history of nicotine dependence: Secondary | ICD-10-CM | POA: Insufficient documentation

## 2019-03-28 DIAGNOSIS — R5383 Other fatigue: Secondary | ICD-10-CM | POA: Insufficient documentation

## 2019-03-28 HISTORY — DX: Type 2 diabetes mellitus without complications: E11.9

## 2019-03-28 LAB — COMPREHENSIVE METABOLIC PANEL
ALT: 20 U/L (ref 0–44)
AST: 18 U/L (ref 15–41)
Albumin: 3.3 g/dL — ABNORMAL LOW (ref 3.5–5.0)
Alkaline Phosphatase: 64 U/L (ref 38–126)
Anion gap: 12 (ref 5–15)
BUN: 19 mg/dL (ref 6–20)
CO2: 24 mmol/L (ref 22–32)
Calcium: 8.9 mg/dL (ref 8.9–10.3)
Chloride: 99 mmol/L (ref 98–111)
Creatinine, Ser: 0.94 mg/dL (ref 0.61–1.24)
GFR calc Af Amer: >60 mL/min (ref >60)
GFR calc non Af Amer: >60 mL/min (ref >60)
Glucose, Bld: 309 mg/dL — ABNORMAL HIGH (ref 70–99)
Potassium: 4.3 mmol/L (ref 3.5–5.1)
Sodium: 135 mmol/L (ref 135–145)
Total Bilirubin: 0.5 mg/dL (ref 0.3–1.2)
Total Protein: 7.3 g/dL (ref 6.5–8.1)

## 2019-03-28 LAB — CBC
HCT: 44.8 % (ref 39.0–52.0)
Hemoglobin: 15.1 g/dL (ref 13.0–17.0)
MCH: 29.8 pg (ref 26.0–34.0)
MCHC: 33.7 g/dL (ref 30.0–36.0)
MCV: 88.5 fL (ref 80.0–100.0)
Platelets: 263 10*3/uL (ref 150–400)
RBC: 5.06 MIL/uL (ref 4.22–5.81)
RDW: 12 % (ref 11.5–15.5)
WBC: 8.4 10*3/uL (ref 4.0–10.5)
nRBC: 0 % (ref 0.0–0.2)

## 2019-03-28 LAB — URINALYSIS, ROUTINE W REFLEX MICROSCOPIC
Bacteria, UA: NONE SEEN
Bilirubin Urine: NEGATIVE
Glucose, UA: >=500 mg/dL — AB
Hgb urine dipstick: NEGATIVE
Ketones, ur: 20 mg/dL — AB
Leukocytes,Ua: NEGATIVE
Nitrite: NEGATIVE
Protein, ur: NEGATIVE mg/dL
Specific Gravity, Urine: 1.037 — ABNORMAL HIGH (ref 1.005–1.030)
pH: 5 (ref 5.0–8.0)

## 2019-03-28 LAB — LIPASE, BLOOD: Lipase: 26 U/L (ref 11–51)

## 2019-03-28 MED ORDER — SODIUM CHLORIDE 0.9% FLUSH: 3.0000 mL | Freq: Once | INTRAVENOUS | Status: DC

## 2019-03-28 NOTE — ED Triage Notes (Signed)
Onset 1 month pt has been feeling tired.  Has been having leg cramps and abd swelling x 2 weeks.

## 2019-03-29 LAB — CBG MONITORING, ED
Glucose-Capillary: 314 mg/dL — ABNORMAL HIGH (ref 70–99)
Glucose-Capillary: 413 mg/dL — ABNORMAL HIGH (ref 70–99)

## 2019-03-29 LAB — HEMOGLOBIN A1C
Hgb A1c MFr Bld: 9.8 % — ABNORMAL HIGH (ref 4.8–5.6)
Mean Plasma Glucose: 234.56 mg/dL

## 2019-03-29 MED ORDER — METFORMIN HCL 500 MG PO TABS: 500.0000 mg | ORAL_TABLET | Freq: Two times a day (BID) | ORAL | 1 refills | Status: DC

## 2019-03-29 NOTE — ED Provider Notes (Signed)
[facility] EMERGENCY DEPARTMENT Provider Note   CSN: [id number] Arrival date & time: 03/28/19  1920     History   Chief Complaint Chief Complaint  Patient presents with  . abdominal swelling  . Fatigue    HPI Mark Ferrell is a 48 y.o. male.     48 year old male with a history of hypertension and anxiety presents to the emergency department for evaluation of hyperglycemia.  He states that he has noticed his blood sugars being elevated over the past month.  Hyperglycemia has been associated with increased fatigue as well as polyuria and polydipsia.  When he is working outside in the sun for hot hours he does develop some cramping in his legs.  Feels that he is more bloated at times, but this is intermittent and resolves spontaneously.  He has not had any recent fevers, viral illnesses.  Has tried modifying his diet to exclude alcohol as well as eating more grilled chicken, but finds that this does not significantly lower his sugar levels.  He is followed at the [facility] for [facility].  The history is provided by the patient. No language interpreter was used.    Past Medical History:  Diagnosis Date  . Anxiety   . Hypertension     Patient Active Problem List   Diagnosis Date Noted  . Cough 07/11/2012  . Anxiety 07/11/2012    Past Surgical History:  Procedure Laterality Date  . BONY PELVIS SURGERY          Home Medications    Prior to Admission medications   Medication Sig Start Date End Date Taking? Authorizing Provider  albuterol (PROVENTIL HFA;VENTOLIN HFA) 108 (90 BASE) MCG/ACT inhaler Inhale 2 puffs into the lungs every 6 (six) hours as needed for wheezing or shortness of breath. 12/05/14   Brewington, Tishira R, PA-C  ketorolac (TORADOL) 10 MG tablet Take 1 tablet (10 mg total) by mouth every 8 (eight) hours. For back pain 01/21/16   Billy Fischer, MD  metaxalone (SKELAXIN) 800 MG tablet TAKE 1 TABLET BY MOUTH 3 TIMES  DAILY. 08/16/16   Alfonse Spruce, FNP  metFORMIN (GLUCOPHAGE) 500 MG tablet Take 1 tablet (500 mg total) by mouth 2 (two) times daily with a meal. 03/29/19   Antonietta Breach, PA-C  oxycodone (ROXICODONE) 30 MG immediate release tablet Take 30 mg by mouth every 4 (four) hours as needed for pain.    [provider]  oxyCODONE-acetaminophen (PERCOCET) 10-325 MG per tablet Take 1 tablet by mouth every 4 (four) hours as needed for pain.     [provider]  traMADol (ULTRAM) 50 MG tablet Take 1 tablet (50 mg total) by mouth every 8 (eight) hours as needed for moderate pain or severe pain. 08/24/16   Alfonse Spruce, FNP    Family History Family History  Problem Relation Age of Onset  . Hypertension Mother   . Cancer Father     Social History Social History   Tobacco Use  . Smoking status: Former Research scientist (life sciences)  . Smokeless tobacco: Never Used  Substance Use Topics  . Alcohol use: No    Alcohol/week: 0.0 standard drinks  . Drug use: No     Allergies   Penicillins   Review of Systems Review of Systems Ten systems reviewed and are negative for acute change, except as noted in the HPI.    Physical Exam Updated Vital Signs BP 119/73 (BP Location: Left Arm)   Pulse 77  Temp 98.3 F (36.8 C) (Oral)   Resp 18   SpO2 95%   Physical Exam Vitals signs and nursing note reviewed.  Constitutional:      General: He is not in acute distress.    Appearance: He is well-developed. He is not diaphoretic.     Comments: Nontoxic appearing and in NAD  HENT:     Head: Normocephalic and atraumatic.  Eyes:     General: No scleral icterus.    Conjunctiva/sclera: Conjunctivae normal.  Neck:     Musculoskeletal: Normal range of motion.  Cardiovascular:     Rate and Rhythm: Normal rate and regular rhythm.     Pulses: Normal pulses.  Pulmonary:     Effort: Pulmonary effort is normal. No respiratory distress.     Comments: Respirations even and unlabored Abdominal:      Comments: Obese abdomen  Musculoskeletal: Normal range of motion.  Skin:    General: Skin is warm and dry.     Coloration: Skin is not pale.     Findings: No erythema or rash.  Neurological:     Mental Status: He is alert and oriented to person, place, and time.     Coordination: Coordination normal.  Psychiatric:        Behavior: Behavior normal.      ED Treatments / Results  Labs (all labs ordered are listed, but only abnormal results are displayed) Labs Reviewed  COMPREHENSIVE METABOLIC PANEL - Abnormal; Notable for the following components:      Result Value   Glucose, Bld 309 (*)    Albumin 3.3 (*)    All other components within normal limits  URINALYSIS, ROUTINE W REFLEX MICROSCOPIC - Abnormal; Notable for the following components:   Specific Gravity, Urine 1.037 (*)    Glucose, UA >=500 (*)    Ketones, ur 20 (*)    All other components within normal limits  CBG MONITORING, ED - Abnormal; Notable for the following components:   Glucose-Capillary 413 (*)    All other components within normal limits  LIPASE, BLOOD  CBC  HEMOGLOBIN A1C    EKG None  Radiology No results found.  Procedures Procedures (including critical care time)  Medications Ordered in ED Medications  sodium chloride flush (NS) 0.9 % injection 3 mL (has no administration in time range)     Initial Impression / Assessment and Plan / ED Course  I have reviewed the triage vital signs and the nursing notes.  Pertinent labs & imaging results that were available during my care of the patient were reviewed by me and considered in my medical decision making (see chart for details).        Patient presenting for persistent hyperglycemia over the past month associated with fatigue, polyuria, polydipsia.  His CBG is 413.  He has no evidence of DKA on his work-up.  Labs are, otherwise, reassuring.  A hemoglobin A1c was drawn.  Plan to start the patient on metformin.  He has been instructed to  follow-up with his primary care doctor for recheck of his sugars within the week.  Encouraged PO fluid hydration.  Return precautions discussed and provided. Patient discharged in stable condition with no unaddressed concerns.   Final Clinical Impressions(s) / ED Diagnoses   Final diagnoses:  New onset type 2 diabetes mellitus ([place])    ED Discharge Orders         Ordered    metFORMIN (GLUCOPHAGE) 500 MG tablet  2 times daily with meals  03/29/19 0518           Antonietta Breach, PA-C 03/29/19 Galateo, Graham, MD 03/29/19 [phone]

## 2019-03-29 NOTE — ED Notes (Signed)
Patient verbalizes understanding of discharge instructions. Opportunity for questioning and answers were provided. Armband removed by staff, pt discharged from ED ambulatory.   

## 2019-03-29 NOTE — Discharge Instructions (Signed)
You were found to have new onset type 2 diabetes.  Take metformin as prescribed and follow-up with your primary care doctor in 1 week for recheck of your sugar levels.  Drink plenty of fluids to prevent dehydration.  Limit your intake of daily carbohydrates as this can cause your sugar levels to spike.  Also try and avoid alcohol and sugary drinks such as soda.  Your diabetes will also be helped by incorporating daily exercise into your routine.  Return to the ED for new or concerning symptoms.

## 2019-04-02 ENCOUNTER — Other Ambulatory Visit: Payer: Self-pay

## 2019-04-02 ENCOUNTER — Encounter (HOSPITAL_COMMUNITY): Payer: Self-pay

## 2019-04-02 ENCOUNTER — Emergency Department (HOSPITAL_COMMUNITY)
Admission: EM | Admit: 2019-04-02 | Discharge: 2019-04-02 | Disposition: A | Discharge status: Home / Self Care | Payer: Self-pay | Attending: Emergency Medicine | Admitting: Emergency Medicine

## 2019-04-02 DIAGNOSIS — E1165 Type 2 diabetes mellitus with hyperglycemia: Secondary | ICD-10-CM | POA: Insufficient documentation

## 2019-04-02 DIAGNOSIS — R739 Hyperglycemia, unspecified: Secondary | ICD-10-CM

## 2019-04-02 DIAGNOSIS — E119 Type 2 diabetes mellitus without complications: Secondary | ICD-10-CM

## 2019-04-02 DIAGNOSIS — I10 Essential (primary) hypertension: Secondary | ICD-10-CM | POA: Insufficient documentation

## 2019-04-02 DIAGNOSIS — Z7984 Long term (current) use of oral hypoglycemic drugs: Secondary | ICD-10-CM | POA: Insufficient documentation

## 2019-04-02 DIAGNOSIS — Z87891 Personal history of nicotine dependence: Secondary | ICD-10-CM | POA: Insufficient documentation

## 2019-04-02 LAB — CBC WITH DIFFERENTIAL/PLATELET
Abs Immature Granulocytes: 0.03 10*3/uL (ref 0.00–0.07)
Basophils Absolute: 0.1 10*3/uL (ref 0.0–0.1)
Basophils Relative: 1 %
Eosinophils Absolute: 0.4 10*3/uL (ref 0.0–0.5)
Eosinophils Relative: 6 %
HCT: 46.8 % (ref 39.0–52.0)
Hemoglobin: 15.4 g/dL (ref 13.0–17.0)
Immature Granulocytes: 1 %
Lymphocytes Relative: 23 %
Lymphs Abs: 1.5 10*3/uL (ref 0.7–4.0)
MCH: 29.3 pg (ref 26.0–34.0)
MCHC: 32.9 g/dL (ref 30.0–36.0)
MCV: 89 fL (ref 80.0–100.0)
Monocytes Absolute: 0.5 10*3/uL (ref 0.1–1.0)
Monocytes Relative: 7 %
Neutro Abs: 4.2 10*3/uL (ref 1.7–7.7)
Neutrophils Relative %: 62 %
Platelets: 229 10*3/uL (ref 150–400)
RBC: 5.26 MIL/uL (ref 4.22–5.81)
RDW: 11.9 % (ref 11.5–15.5)
WBC: 6.6 10*3/uL (ref 4.0–10.5)
nRBC: 0 % (ref 0.0–0.2)

## 2019-04-02 LAB — POCT I-STAT EG7
Acid-Base Excess: 6 mmol/L — ABNORMAL HIGH (ref 0.0–2.0)
Bicarbonate: 33.1 mmol/L — ABNORMAL HIGH (ref 20.0–28.0)
Calcium, Ion: 1.26 mmol/L (ref 1.15–1.40)
HCT: 47 % (ref 39.0–52.0)
Hemoglobin: 16 g/dL (ref 13.0–17.0)
O2 Saturation: 57 %
Potassium: 4.7 mmol/L (ref 3.5–5.1)
Sodium: 135 mmol/L (ref 135–145)
TCO2: 35 mmol/L — ABNORMAL HIGH (ref 22–32)
pCO2, Ven: 53.5 mmHg (ref 44.0–60.0)
pH, Ven: 7.399 (ref 7.250–7.430)
pO2, Ven: 30 mmHg — CL (ref 32.0–45.0)

## 2019-04-02 LAB — BASIC METABOLIC PANEL
Anion gap: 10 (ref 5–15)
BUN: 13 mg/dL (ref 6–20)
CO2: 27 mmol/L (ref 22–32)
Calcium: 9.5 mg/dL (ref 8.9–10.3)
Chloride: 99 mmol/L (ref 98–111)
Creatinine, Ser: 0.74 mg/dL (ref 0.61–1.24)
GFR calc Af Amer: >60 mL/min (ref >60)
GFR calc non Af Amer: >60 mL/min (ref >60)
Glucose, Bld: 270 mg/dL — ABNORMAL HIGH (ref 70–99)
Potassium: 4.8 mmol/L (ref 3.5–5.1)
Sodium: 136 mmol/L (ref 135–145)

## 2019-04-02 LAB — CBG MONITORING, ED: Glucose-Capillary: 245 mg/dL — ABNORMAL HIGH (ref 70–99)

## 2019-04-02 MED ORDER — SODIUM CHLORIDE 0.9 % IV BOLUS
1000.0000 mL | Freq: Once | INTRAVENOUS | Status: AC
  Administered 2019-04-02: 1000 mL via INTRAVENOUS

## 2019-04-02 MED ORDER — INSULIN GLARGINE 100 UNIT/ML ~~LOC~~ SOLN: 10.0000 [IU] | Freq: Every day | SUBCUTANEOUS | 11 refills | Status: DC

## 2019-04-02 MED ORDER — LIVING WELL WITH DIABETES BOOK
Freq: Once | Status: AC
  Administered 2019-04-02: 11:00:00
  Filled 2019-04-02: qty 1

## 2019-04-02 MED ORDER — BLOOD GLUCOSE MONITOR KIT: PACK | 0 refills | Status: AC

## 2019-04-02 NOTE — Discharge Instructions (Addendum)
Future Appointments  Date Time Provider [place]  04/13/2019 10:30 AM Antony Blackbird, MD [facility] None   Please go to your appointment with Dr. Chapman Fitch.  Recommend calling our office to see if you can get a closer appointment this coming week.  Please use the glucometer as instructed, at a minimum you should be checking her blood sugars at night and in the morning.  Would ideally also check blood sugars at each meal.  Recommend starting the insulin regimen as prescribed.  If you are unable to afford this, you can wait till you follow-up with the primary care office as they may have additional resources for you.

## 2019-04-02 NOTE — ED Provider Notes (Signed)
[facility] EMERGENCY DEPARTMENT Provider Note   CSN: [id number] Arrival date & time: 04/02/19  0841     History   Chief Complaint Chief Complaint  Patient presents with  . Hyperglycemia    HPI TRAE HORWITZ is a 48 y.o. male.  Presents emergency room with chief complaint of elevated blood sugars.  Patient states he was seen in emergency department recently diagnosed with new onset type 2 diabetes mellitus.  Started on metformin and discharged with plan to follow-up with PCP.  Patient does have a PCP but was given information for [facility], for which she has an appointment scheduled on September 18.  Patient states he is been checking his glucose periodically with a friend's glucometer and it has ranged from 300s to 400s despite starting the metformin.  Patient states that he still having symptoms of polyuria, polydipsia.  Generalized fatigue.  Denies any nausea, vomiting, abdominal pain, dysuria, fevers.  Patient states he does not have insurance, is very concerned about how he can afford office visits and medications.    HPI  Past Medical History:  Diagnosis Date  . Anxiety   . Diabetes mellitus without complication ([place]) 99991111  . Hypertension     Patient Active Problem List   Diagnosis Date Noted  . Cough 07/11/2012  . Anxiety 07/11/2012    Past Surgical History:  Procedure Laterality Date  . BONY PELVIS SURGERY          Home Medications    Prior to Admission medications   Medication Sig Start Date End Date Taking? Authorizing Provider  albuterol (PROVENTIL HFA;VENTOLIN HFA) 108 (90 BASE) MCG/ACT inhaler Inhale 2 puffs into the lungs every 6 (six) hours as needed for wheezing or shortness of breath. 12/05/14   Brewington, Tishira R, PA-C  ketorolac (TORADOL) 10 MG tablet Take 1 tablet (10 mg total) by mouth every 8 (eight) hours. For back pain 01/21/16   Billy Fischer, MD  metaxalone (SKELAXIN) 800 MG tablet TAKE 1 TABLET BY  MOUTH 3 TIMES DAILY. 08/16/16   Alfonse Spruce, FNP  metFORMIN (GLUCOPHAGE) 500 MG tablet Take 1 tablet (500 mg total) by mouth 2 (two) times daily with a meal. 03/29/19   Antonietta Breach, PA-C  oxycodone (ROXICODONE) 30 MG immediate release tablet Take 30 mg by mouth every 4 (four) hours as needed for pain.    [provider]  oxyCODONE-acetaminophen (PERCOCET) 10-325 MG per tablet Take 1 tablet by mouth every 4 (four) hours as needed for pain.     [provider]  traMADol (ULTRAM) 50 MG tablet Take 1 tablet (50 mg total) by mouth every 8 (eight) hours as needed for moderate pain or severe pain. 08/24/16   Alfonse Spruce, FNP    Family History Family History  Problem Relation Age of Onset  . Hypertension Mother   . Cancer Father     Social History Social History   Tobacco Use  . Smoking status: Former Research scientist (life sciences)  . Smokeless tobacco: Never Used  Substance Use Topics  . Alcohol use: No    Alcohol/week: 0.0 standard drinks  . Drug use: No     Allergies   Penicillins   Review of Systems Review of Systems  Constitutional: Negative for chills and fever.  HENT: Negative for ear pain and sore throat.   Eyes: Negative for pain and visual disturbance.  Respiratory: Negative for cough and shortness of breath.   Cardiovascular: Negative for chest pain and palpitations.  Gastrointestinal: Negative for abdominal pain and vomiting.  Endocrine: Positive for polydipsia and polyuria. Negative for polyphagia.  Genitourinary: Negative for dysuria and hematuria.  Musculoskeletal: Negative for arthralgias and back pain.  Skin: Negative for color change and rash.  Neurological: Negative for seizures and syncope.  All other systems reviewed and are negative.    Physical Exam Updated Vital Signs BP (!) 154/101 (BP Location: Right Arm)   Pulse 79   Temp 98.3 F (36.8 C) (Oral)   Resp 20   SpO2 98%   Physical Exam Vitals signs and nursing note reviewed.   Constitutional:      Appearance: He is well-developed.  HENT:     Head: Normocephalic and atraumatic.  Eyes:     Conjunctiva/sclera: Conjunctivae normal.  Neck:     Musculoskeletal: Neck supple.  Cardiovascular:     Rate and Rhythm: Normal rate and regular rhythm.     Heart sounds: No murmur.  Pulmonary:     Effort: Pulmonary effort is normal. No respiratory distress.     Breath sounds: Normal breath sounds.  Abdominal:     General: Abdomen is flat.     Palpations: Abdomen is soft.     Tenderness: There is no abdominal tenderness.  Skin:    General: Skin is warm and dry.     Capillary Refill: Capillary refill takes less than 2 seconds.  Neurological:     General: No focal deficit present.     Mental Status: He is alert and oriented to person, place, and time.  Psychiatric:        Mood and Affect: Mood normal.        Behavior: Behavior normal.        Thought Content: Thought content normal.      ED Treatments / Results  Labs (all labs ordered are listed, but only abnormal results are displayed) Labs Reviewed  BASIC METABOLIC PANEL - Abnormal; Notable for the following components:      Result Value   Glucose, Bld 270 (*)    All other components within normal limits  CBG MONITORING, ED - Abnormal; Notable for the following components:   Glucose-Capillary 245 (*)    All other components within normal limits  POCT I-STAT EG7 - Abnormal; Notable for the following components:   pO2, Ven 30.0 (*)    Bicarbonate 33.1 (*)    TCO2 35 (*)    Acid-Base Excess 6.0 (*)    All other components within normal limits  CBC WITH DIFFERENTIAL/PLATELET  URINALYSIS, ROUTINE W REFLEX MICROSCOPIC  BLOOD GAS, VENOUS    EKG None  Radiology No results found.  Procedures Procedures (including critical care time)  Medications Ordered in ED Medications  living well with diabetes book MISC (has no administration in time range)  sodium chloride 0.9 % bolus 1,000 mL (1,000 mLs  Intravenous New Bag/Given 04/02/19 0932)     Initial Impression / Assessment and Plan / ED Course  I have reviewed the triage vital signs and the nursing notes.  Pertinent labs & imaging results that were available during my care of the patient were reviewed by me and considered in my medical decision making (see chart for details).       48 year old male who presents to the emergency department with complaint of polyuria, polydipsia in setting of recent diagnosis type 2 diabetes mellitus.  On exam patient well-appearing, blood sugar here 245, labs without electrolyte abnormality, no DKA.  Believe patient is appropriate for continued outpatient management.  A1c was 9.8, reviewed case with our pharmacy, believe he would be appropriate first initiating small basal insulin regimen while awaiting PCP appointment.  Patient states he is unable to afford the insulin, consulted case management to see if we can assist with this.  Additionally recommended he call the office to get closer appointment as they may also be able to have additional resources.  Provided Rx for glucometer and test strips.  Provided Rx for Lantus 10 units nightly.  Reviewed return precautions with patient in detail, will discharge home.  Emphasize need for lifestyle, diet modifications.  After the discussed management above, the patient was determined to be safe for discharge.  The patient was in agreement with this plan and all questions regarding their care were answered.  ED return precautions were discussed and the patient will return to the ED with any significant worsening of condition.   Final Clinical Impressions(s) / ED Diagnoses   Final diagnoses:  Type 2 diabetes mellitus without complication, without long-term current use of insulin ([place])  Hyperglycemia    ED Discharge Orders    None       Lucrezia Starch, MD 04/02/19 1044

## 2019-04-02 NOTE — Progress Notes (Signed)
Inpatient Diabetes Program Recommendations  AACE/ADA: New Consensus Statement on Inpatient Glycemic Control (2015)  Target Ranges:  Prepandial:   less than 140 mg/dL      Peak postprandial:   less than 180 mg/dL (1-2 hours)      Critically ill patients:  140 - 180 mg/dL   Lab Results  Component Value Date   GLUCAP 245 (H) 04/02/2019   HGBA1C 9.8 (H) 03/29/2019    Review of Glycemic Control  Diabetes history: New onset DM2 Outpatient Diabetes medications: Metformin 500 mg bid (started on ED visit 03/29/19) Current orders for Inpatient glycemic control: None  Inpatient Diabetes Program Recommendations:   Spoke with patient via phone @ length. Patient has been drinking multiple gatorade over the past several days and reviewed sugar content in drinks and requested patient to limit sugary drinks and carbohydrates. Until the past 4 days, patient has been drinking up to 2 liters of regular [facility] per day along with milk and other high carbohydrate drinks.Also reviewed basic plate method healthy nutrition and snacks. Patient states he has been to [facility] in the past and has an appt. On April 13, 2019. Patient works fulltime but job does not offer benefits. Ordered Living Well with Diabetes book for patient from pharmacy and patient plans to read @ home to learn more about diabetes.Explained to patient he has diabetes type 2 which is insulin resistance. Also gave patient information about Relion glucose meter and strips @ [facility] and patient acknowledged the meter is affordable for him.  Patient has been taking the prescribed Metformin 500 mg bid and tolerating well. May consider increase in dosage and followup @ [place].  Thank you, Nani Gasser. Brolin Dambrosia, RN, MSN, CDE  Diabetes Coordinator Inpatient Glycemic Control Team Team Pager [phone] (8am-5pm) 04/02/2019 10:32 AM

## 2019-04-02 NOTE — ED Notes (Signed)
CM unavailable. Pt will call back tomorrow to speak with CM about blood glucose meter kit and insulin glargine.

## 2019-04-02 NOTE — TOC Initial Note (Signed)
Transition of Care [facility]) - Initial/Assessment Note    Patient Details  Name: Mark Ferrell MRN: [id number] Date of Birth: 1971-03-22  Transition of [facility]) CM/SW Contact:    Erenest Rasher, RN Phone Number: [phone] 04/02/2019, 4:04 PM  Clinical Narrative:                 TOC CM spoke to pt and he will pick up his Rx from [facility] on tomorrow. Has appt at [facility] on 04/13/2019. Gave permission to email his work excuse to email, [email or web address].   Expected Discharge Plan: Home/Self Care Barriers to Discharge: No Barriers Identified   Patient Goals and CMS Choice        Expected Discharge Plan and Services Expected Discharge Plan: Home/Self Care   Discharge Planning Services: CM Consult, Medication Assistance, Follow-up appt scheduled   Living arrangements for the past 2 months: Apartment                                      Prior Living Arrangements/Services Living arrangements for the past 2 months: Apartment Lives with:: Spouse Patient language and need for interpreter reviewed:: Yes Do you feel safe going back to the place where you live?: Yes      Need for Family Participation in Patient Care: No (Comment) Care giver support system in place?: No (comment)   Criminal Activity/Legal Involvement Pertinent to Current Situation/Hospitalization: No - Comment as needed  Activities of Daily Living      Permission Sought/Granted Permission sought to share information with : PCP, Case Manager, Family Supports Permission granted to share information with : Yes, Verbal Permission Granted              Emotional Assessment       Orientation: : Oriented to Self, Oriented to Place, Oriented to  Time, Oriented to Situation   Psych Involvement: No (comment)  Admission diagnosis:  sick Patient Active Problem List   Diagnosis Date Noted  . Cough 07/11/2012  . Anxiety 07/11/2012   PCP:  Alfonse Spruce, FNP Pharmacy:   [id number] -  [place], [place] [id number] [place] [id number] Phone: [phone] Fax: [phone]  [place], [place] - [address] [place] [address] Phone: [phone] Fax: [phone]  [facility] - [place], [place] - [address] [place]  [address] Phone: [phone] Fax: [place], [address] [place] [address] Phone: [phone] Fax: [phone]     Social Determinants of Health (SDOH) Interventions    Readmission Risk Interventions No flowsheet data found.

## 2019-04-02 NOTE — ED Triage Notes (Signed)
Pt here 5 days ago and newly diagnosed with DM, placed on metformin, has changed his diet and has been taking metformin and CBG has been consistently greater than 400 and pt continues to feel extreme fatigue, drinking a lot and urinating frequently. Does not believe metformin is working and pt and cant working feeling like this.

## 2019-04-02 NOTE — ED Notes (Signed)
Pt's CBG result was 245. Informed April-RN.

## 2019-04-03 MED FILL — TRUEplus LANCETS 28G MISC: 25 days supply | Qty: 100 | Fill #0

## 2019-04-03 MED FILL — !LANTUS 100 UNITS/ML VIAL: 100 | 28 days supply | Qty: 10 | Fill #0

## 2019-04-03 MED FILL — !TRUE METRIX BLOOD GLUCOSE: 30 days supply | Qty: 1 | Fill #0

## 2019-04-03 MED FILL — TRUE METRIX TEST STRIP: 25 days supply | Qty: 100 | Fill #0

## 2019-04-13 ENCOUNTER — Other Ambulatory Visit: Payer: Self-pay

## 2019-04-13 ENCOUNTER — Ambulatory Visit: Payer: Self-pay | Admitting: Family Medicine
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
## 2019-04-16 ENCOUNTER — Ambulatory Visit: Payer: Self-pay | Attending: Family Medicine | Admitting: Critical Care Medicine

## 2019-04-16 ENCOUNTER — Other Ambulatory Visit: Payer: Self-pay

## 2019-04-16 ENCOUNTER — Encounter: Payer: Self-pay | Admitting: Critical Care Medicine

## 2019-04-16 VITALS — BP 116/82 | HR 76 | Temp 98.1°F | Ht 72.0 in | Wt 285.0 lb

## 2019-04-16 DIAGNOSIS — E119 Type 2 diabetes mellitus without complications: Secondary | ICD-10-CM | POA: Insufficient documentation

## 2019-04-16 DIAGNOSIS — F329 Major depressive disorder, single episode, unspecified: Secondary | ICD-10-CM

## 2019-04-16 DIAGNOSIS — Z114 Encounter for screening for human immunodeficiency virus [HIV]: Secondary | ICD-10-CM

## 2019-04-16 DIAGNOSIS — E1165 Type 2 diabetes mellitus with hyperglycemia: Secondary | ICD-10-CM

## 2019-04-16 DIAGNOSIS — F419 Anxiety disorder, unspecified: Secondary | ICD-10-CM

## 2019-04-16 DIAGNOSIS — R350 Frequency of micturition: Secondary | ICD-10-CM | POA: Insufficient documentation

## 2019-04-16 DIAGNOSIS — Z23 Encounter for immunization: Secondary | ICD-10-CM

## 2019-04-16 DIAGNOSIS — G8929 Other chronic pain: Secondary | ICD-10-CM | POA: Insufficient documentation

## 2019-04-16 DIAGNOSIS — G894 Chronic pain syndrome: Secondary | ICD-10-CM

## 2019-04-16 MED ORDER — METFORMIN HCL 1000 MG PO TABS: 500.0000 mg | ORAL_TABLET | Freq: Two times a day (BID) | ORAL | 3 refills | Status: DC

## 2019-04-16 MED ORDER — ALBUTEROL SULFATE HFA 108 (90 BASE) MCG/ACT IN AERS: 2.0000 | INHALATION_SPRAY | Freq: Four times a day (QID) | RESPIRATORY_TRACT | 0 refills | Status: DC | PRN

## 2019-04-16 MED ORDER — KETOROLAC TROMETHAMINE 10 MG PO TABS: 10.0000 mg | ORAL_TABLET | Freq: Three times a day (TID) | ORAL | 0 refills | Status: DC

## 2019-04-16 MED ORDER — METFORMIN HCL 1000 MG PO TABS: 1000.0000 mg | ORAL_TABLET | Freq: Two times a day (BID) | ORAL | 3 refills | Status: DC

## 2019-04-16 MED ORDER — INSULIN GLARGINE 100 UNIT/ML ~~LOC~~ SOLN: 30.0000 [IU] | Freq: Every day | SUBCUTANEOUS | 11 refills | Status: DC

## 2019-04-16 MED ORDER — CITALOPRAM HYDROBROMIDE 20 MG PO TABS: 20.0000 mg | ORAL_TABLET | Freq: Every day | ORAL | 3 refills | Status: DC

## 2019-04-16 MED FILL — metFORMIN HCL 1000 MG TABS: 1000 | 30 days supply | Qty: 60 | Fill #0

## 2019-04-16 MED FILL — !LANTUS 100 UNITS/ML VIAL: 100 | 28 days supply | Qty: 10 | Fill #0

## 2019-04-16 MED FILL — !VENTOLIN HFA INHALER: 108 (90 BAS | 25 days supply | Qty: 18 | Fill #0

## 2019-04-16 MED FILL — ?CITALOPRAM HBR 20 MG TABLE: 20 | 30 days supply | Qty: 30 | Fill #0

## 2019-04-16 MED FILL — KETOROLAC 10 MG TABLET: 10 | 30 days supply | Qty: 90 | Fill #0

## 2019-04-16 NOTE — Patient Instructions (Addendum)
I sent a prescription for citalopram or Celexa to take 20 mg daily for anxiety and depression  I sent a refill to our pharmacy and Toradol note that this in combination with Celexa may increase your risk for stomach bleeding so be mindful of that while you take the Toradol for your back pain   Increase Lantus to 30 units and take this at bedtime  Increase metformin to thousand milligrams twice a day  Refills on your insulin and metformin were sent to our pharmacy  I did make a referral to the [place] pain clinic however even need to get your financial paperwork completed and approved first before you can make that appointment  An appointment with our clinical pharmacist to go over your diabetic program will be made  An appointment with our licensed clinical social worker will be made to discuss your anxiety and depression and get you connected to resources  We will have you fill out financial paperwork and make an appointment with our financial counselor for financial assistance  A tetanus vaccine and flu vaccine was given at this visit  Labs today include a metabolic panel, lipid panel, complete blood count, urinalysis, protein in urine check, and HIV study we will call you with results  Follow a diabetic diet as outlined below  Return to see Dr. Joya Gaskins in 1 month   Diabetes Mellitus and Nutrition, Adult When you have diabetes (diabetes mellitus), it is very important to have healthy eating habits because your blood sugar (glucose) levels are greatly affected by what you eat and drink. Eating healthy foods in the appropriate amounts, at about the same times every day, can help you:  Control your blood glucose.  Lower your risk of heart disease.  Improve your blood pressure.  Reach or maintain a healthy weight. Every person with diabetes is different, and each person has different needs for a meal plan. Your health care provider may recommend that you work with a diet and  nutrition specialist (dietitian) to make a meal plan that is best for you. Your meal plan may vary depending on factors such as:  The calories you need.  The medicines you take.  Your weight.  Your blood glucose, blood pressure, and cholesterol levels.  Your activity level.  Other health conditions you have, such as heart or kidney disease. How do carbohydrates affect me? Carbohydrates, also called carbs, affect your blood glucose level more than any other type of food. Eating carbs naturally raises the amount of glucose in your blood. Carb counting is a method for keeping track of how many carbs you eat. Counting carbs is important to keep your blood glucose at a healthy level, especially if you use insulin or take certain oral diabetes medicines. It is important to know how many carbs you can safely have in each meal. This is different for every person. Your dietitian can help you calculate how many carbs you should have at each meal and for each snack. Foods that contain carbs include:  Bread, cereal, rice, pasta, and crackers.  Potatoes and corn.  Peas, beans, and lentils.  Milk and yogurt.  Fruit and juice.  Desserts, such as cakes, cookies, ice cream, and candy. How does alcohol affect me? Alcohol can cause a sudden decrease in blood glucose (hypoglycemia), especially if you use insulin or take certain oral diabetes medicines. Hypoglycemia can be a life-threatening condition. Symptoms of hypoglycemia (sleepiness, dizziness, and confusion) are similar to symptoms of having too much alcohol. If  your health care provider says that alcohol is safe for you, follow these guidelines:  Limit alcohol intake to no more than 1 drink per day for nonpregnant women and 2 drinks per day for men. One drink equals 12 oz of beer, 5 oz of wine, or 1 oz of hard liquor.  Do not drink on an empty stomach.  Keep yourself hydrated with water, diet soda, or unsweetened iced tea.  Keep in mind  that regular soda, juice, and other mixers may contain a lot of sugar and must be counted as carbs. What are tips for following this plan?  Reading food labels  Start by checking the serving size on the "Nutrition Facts" label of packaged foods and drinks. The amount of calories, carbs, fats, and other nutrients listed on the label is based on one serving of the item. Many items contain more than one serving per package.  Check the total grams (g) of carbs in one serving. You can calculate the number of servings of carbs in one serving by dividing the total carbs by 15. For example, if a food has 30 g of total carbs, it would be equal to 2 servings of carbs.  Check the number of grams (g) of saturated and trans fats in one serving. Choose foods that have low or no amount of these fats.  Check the number of milligrams (mg) of salt (sodium) in one serving. Most people should limit total sodium intake to less than 2,300 mg per day.  Always check the nutrition information of foods labeled as "low-fat" or "nonfat". These foods may be higher in added sugar or refined carbs and should be avoided.  Talk to your dietitian to identify your daily goals for nutrients listed on the label. Shopping  Avoid buying canned, premade, or processed foods. These foods tend to be high in fat, sodium, and added sugar.  Shop around the outside edge of the grocery store. This includes fresh fruits and vegetables, bulk grains, fresh meats, and fresh dairy. Cooking  Use low-heat cooking methods, such as baking, instead of high-heat cooking methods like deep frying.  Cook using healthy oils, such as olive, canola, or sunflower oil.  Avoid cooking with butter, cream, or high-fat meats. Meal planning  Eat meals and snacks regularly, preferably at the same times every day. Avoid going long periods of time without eating.  Eat foods high in fiber, such as fresh fruits, vegetables, beans, and whole grains. Talk to  your dietitian about how many servings of carbs you can eat at each meal.  Eat 4-6 ounces (oz) of lean protein each day, such as lean meat, chicken, fish, eggs, or tofu. One oz of lean protein is equal to: ? 1 oz of meat, chicken, or fish. ? 1 egg. ?  cup of tofu.  Eat some foods each day that contain healthy fats, such as avocado, nuts, seeds, and fish. Lifestyle  Check your blood glucose regularly.  Exercise regularly as told by your health care provider. This may include: ? 150 minutes of moderate-intensity or vigorous-intensity exercise each week. This could be brisk walking, biking, or water aerobics. ? Stretching and doing strength exercises, such as yoga or weightlifting, at least 2 times a week.  Take medicines as told by your health care provider.  Do not use any products that contain nicotine or tobacco, such as cigarettes and e-cigarettes. If you need help quitting, ask your health care provider.  Work with a Social worker or diabetes educator to  identify strategies to manage stress and any emotional and social challenges. Questions to ask a health care provider  Do I need to meet with a diabetes educator?  Do I need to meet with a dietitian?  What number can I call if I have questions?  When are the best times to check my blood glucose? Where to find more information:  American Diabetes Association: diabetes.org  Academy of Nutrition and Dietetics: www.eatright.[employer] of Diabetes and Digestive and Kidney Diseases (NIH): [employer] Summary  A healthy meal plan will help you control your blood glucose and maintain a healthy lifestyle.  Working with a diet and nutrition specialist (dietitian) can help you make a meal plan that is best for you.  Keep in mind that carbohydrates (carbs) and alcohol have immediate effects on your blood glucose levels. It is important to count carbs and to use alcohol carefully. This information is not intended to  replace advice given to you by your health care provider. Make sure you discuss any questions you have with your health care provider. Document Released: 04/08/2005 Document Revised: 06/24/2017 Document Reviewed: 08/16/2016 Elsevier Patient Education  2020 [employer].

## 2019-04-16 NOTE — Assessment & Plan Note (Signed)
Urinary frequency likely exacerbated by elevated blood sugars  Patient asked to continue to push fluids and follow diabetic diet and we will check a urinalysis and urine microalbumin also recheck a complete metabolic panel and blood count

## 2019-04-16 NOTE — Assessment & Plan Note (Signed)
I indicated to the patient we cannot prescribe opiates in this clinic however I will make a referral to pain management and he will need to apply for financial assistance to keep this appointment.  I did refill the Toradol 10 mg every 8 hours as needed

## 2019-04-16 NOTE — Assessment & Plan Note (Signed)
Severe anxiety and depression likely exacerbated in patient's chronic pain syndrome  We will begin citalopram 20 mg daily and refer to our licensed clinical social worker for further follow-up

## 2019-04-16 NOTE — Assessment & Plan Note (Addendum)
Uncontrolled diabetes type 2 recent hemoglobin A1c was 9.8  Plan will be to increase Lantus to 30 units daily and increase metformin to thousand milligrams twice daily  We will also arrange for a follow-up visit with our licensed clinical pharmacist  We will check lipid panel as well

## 2019-04-17 ENCOUNTER — Other Ambulatory Visit: Payer: Self-pay | Admitting: Critical Care Medicine

## 2019-04-17 DIAGNOSIS — E1169 Type 2 diabetes mellitus with other specified complication: Secondary | ICD-10-CM

## 2019-04-17 DIAGNOSIS — E785 Hyperlipidemia, unspecified: Secondary | ICD-10-CM | POA: Insufficient documentation

## 2019-04-17 LAB — CBC WITH DIFFERENTIAL/PLATELET
Basophils Absolute: 0.1 10*3/uL (ref 0.0–0.2)
Basos: 1 %
EOS (ABSOLUTE): 0.6 10*3/uL — ABNORMAL HIGH (ref 0.0–0.4)
Eos: 8 %
Hematocrit: 43.1 % (ref 37.5–51.0)
Hemoglobin: 14.6 g/dL (ref 13.0–17.7)
Immature Grans (Abs): 0 10*3/uL (ref 0.0–0.1)
Immature Granulocytes: 0 %
Lymphocytes Absolute: 2.2 10*3/uL (ref 0.7–3.1)
Lymphs: 31 %
MCH: 29.6 pg (ref 26.6–33.0)
MCHC: 33.9 g/dL (ref 31.5–35.7)
MCV: 87 fL (ref 79–97)
Monocytes Absolute: 0.5 10*3/uL (ref 0.1–0.9)
Monocytes: 7 %
Neutrophils Absolute: 3.8 10*3/uL (ref 1.4–7.0)
Neutrophils: 53 %
Platelets: 206 10*3/uL (ref 150–450)
RBC: 4.93 x10E6/uL (ref 4.14–5.80)
RDW: 12.1 % (ref 11.6–15.4)
WBC: 7.2 10*3/uL (ref 3.4–10.8)

## 2019-04-17 LAB — HIV ANTIBODY (ROUTINE TESTING W REFLEX): HIV Screen 4th Generation wRfx: NONREACTIVE

## 2019-04-17 LAB — MICROSCOPIC EXAMINATION
Bacteria, UA: NONE SEEN
Casts: NONE SEEN /lpf
Epithelial Cells (non renal): NONE SEEN /hpf (ref 0–10)
WBC, UA: NONE SEEN /hpf (ref 0–5)

## 2019-04-17 LAB — LIPID PANEL
Chol/HDL Ratio: 4.3 ratio (ref 0.0–5.0)
Cholesterol, Total: 169 mg/dL (ref 100–199)
HDL: 39 mg/dL — ABNORMAL LOW (ref >39)
LDL Chol Calc (NIH): 103 mg/dL — ABNORMAL HIGH (ref 0–99)
Triglycerides: 154 mg/dL — ABNORMAL HIGH (ref 0–149)
VLDL Cholesterol Cal: 27 mg/dL (ref 5–40)

## 2019-04-17 LAB — URINALYSIS, COMPLETE
Bilirubin, UA: NEGATIVE
Ketones, UA: NEGATIVE
Leukocytes,UA: NEGATIVE
Nitrite, UA: NEGATIVE
RBC, UA: NEGATIVE
Specific Gravity, UA: >=1.03 — AB (ref 1.005–1.030)
Urobilinogen, Ur: 0.2 mg/dL (ref 0.2–1.0)
pH, UA: 5.5 (ref 5.0–7.5)

## 2019-04-17 LAB — COMPREHENSIVE METABOLIC PANEL
ALT: 16 IU/L (ref 0–44)
AST: 12 IU/L (ref 0–40)
Albumin/Globulin Ratio: 1.5 (ref 1.2–2.2)
Albumin: 4.1 g/dL (ref 4.0–5.0)
Alkaline Phosphatase: 78 IU/L (ref 39–117)
BUN/Creatinine Ratio: 16 (ref 9–20)
BUN: 13 mg/dL (ref 6–24)
Bilirubin Total: 0.2 mg/dL (ref 0.0–1.2)
CO2: 27 mmol/L (ref 20–29)
Calcium: 9.1 mg/dL (ref 8.7–10.2)
Chloride: 100 mmol/L (ref 96–106)
Creatinine, Ser: 0.81 mg/dL (ref 0.76–1.27)
GFR calc Af Amer: 123 mL/min/{1.73_m2} (ref >59)
GFR calc non Af Amer: 107 mL/min/{1.73_m2} (ref >59)
Globulin, Total: 2.8 g/dL (ref 1.5–4.5)
Glucose: 203 mg/dL — ABNORMAL HIGH (ref 65–99)
Potassium: 4.6 mmol/L (ref 3.5–5.2)
Sodium: 140 mmol/L (ref 134–144)
Total Protein: 6.9 g/dL (ref 6.0–8.5)

## 2019-04-17 LAB — MICROALBUMIN, URINE: Microalbumin, Urine: 57.6 ug/mL

## 2019-04-17 MED ORDER — ATORVASTATIN CALCIUM 20 MG PO TABS: 20.0000 mg | ORAL_TABLET | Freq: Every day | ORAL | 3 refills | Status: DC

## 2019-04-17 MED FILL — ?ATORVASTATIN 20 MG TABLET: 20 | 30 days supply | Qty: 30 | Fill #0

## 2019-04-17 NOTE — Progress Notes (Signed)
Atorvastatin started for elevated lipids

## 2019-04-18 ENCOUNTER — Telehealth: Payer: Self-pay | Admitting: Critical Care Medicine

## 2019-04-18 NOTE — Telephone Encounter (Signed)
New Message   Pt came in here wanting to speak with you about his application he has a lot of questions and wants to make sure he is turning in the right documents and informations. Please f/u

## 2019-04-19 NOTE — Telephone Encounter (Signed)
LVM to call me back that way I can try to answer the question

## 2019-04-24 ENCOUNTER — Ambulatory Visit: Payer: Self-pay

## 2019-04-27 ENCOUNTER — Ambulatory Visit: Payer: Self-pay | Admitting: Pharmacist

## 2019-05-01 ENCOUNTER — Ambulatory Visit: Payer: Self-pay

## 2019-05-08 ENCOUNTER — Other Ambulatory Visit: Payer: Self-pay | Admitting: Pharmacist

## 2019-05-08 MED ORDER — TRUE METRIX BLOOD GLUCOSE TEST VI STRP: ORAL_STRIP | 12 refills | Status: DC

## 2019-05-08 MED ORDER — TRUEPLUS LANCETS 28G MISC: 6 refills | Status: DC

## 2019-05-08 MED FILL — metFORMIN HCL 1000 MG TABS: 1000 | 30 days supply | Qty: 60 | Fill #1

## 2019-05-08 MED FILL — ?CITALOPRAM HBR 20 MG TABLE: 20 | 30 days supply | Qty: 30 | Fill #1

## 2019-05-08 MED FILL — ?ATORVASTATIN 20 MG TABLET: 20 | 30 days supply | Qty: 30 | Fill #1

## 2019-05-08 MED FILL — !LANTUS 100 UNITS/ML VIAL: 100 | 28 days supply | Qty: 10 | Fill #1

## 2019-05-09 MED FILL — TRUE METRIX TEST STRIP: 25 days supply | Qty: 100 | Fill #0

## 2019-05-09 MED FILL — TRUEplus LANCETS 28G MISC: 25 days supply | Qty: 100 | Fill #0

## 2019-05-14 ENCOUNTER — Other Ambulatory Visit: Payer: Self-pay | Admitting: Critical Care Medicine

## 2019-05-14 MED FILL — !VENTOLIN HFA INHALER: 108 (90 BAS | 25 days supply | Qty: 18 | Fill #0

## 2019-05-17 ENCOUNTER — Encounter: Payer: Self-pay | Admitting: Physical Medicine and Rehabilitation

## 2019-05-17 ENCOUNTER — Ambulatory Visit: Payer: Self-pay | Admitting: Physical Medicine & Rehabilitation

## 2019-05-21 ENCOUNTER — Ambulatory Visit: Payer: Self-pay | Attending: Critical Care Medicine | Admitting: Critical Care Medicine

## 2019-05-21 ENCOUNTER — Encounter: Payer: Self-pay | Admitting: Critical Care Medicine

## 2019-05-21 ENCOUNTER — Other Ambulatory Visit: Payer: Self-pay

## 2019-05-21 ENCOUNTER — Ambulatory Visit: Payer: Medicaid Other | Admitting: Pharmacist

## 2019-05-21 VITALS — BP 137/88 | HR 83 | Temp 98.3°F

## 2019-05-21 DIAGNOSIS — F419 Anxiety disorder, unspecified: Secondary | ICD-10-CM

## 2019-05-21 DIAGNOSIS — G894 Chronic pain syndrome: Secondary | ICD-10-CM

## 2019-05-21 DIAGNOSIS — E785 Hyperlipidemia, unspecified: Secondary | ICD-10-CM

## 2019-05-21 DIAGNOSIS — F329 Major depressive disorder, single episode, unspecified: Secondary | ICD-10-CM

## 2019-05-21 DIAGNOSIS — E1169 Type 2 diabetes mellitus with other specified complication: Secondary | ICD-10-CM

## 2019-05-21 DIAGNOSIS — E1165 Type 2 diabetes mellitus with hyperglycemia: Secondary | ICD-10-CM

## 2019-05-21 MED ORDER — CITALOPRAM HYDROBROMIDE 40 MG PO TABS: 40.0000 mg | ORAL_TABLET | Freq: Every day | ORAL | 3 refills | Status: DC

## 2019-05-21 MED ORDER — GABAPENTIN 300 MG PO CAPS: 300.0000 mg | ORAL_CAPSULE | Freq: Three times a day (TID) | ORAL | 3 refills | Status: DC

## 2019-05-21 MED ORDER — INSULIN GLARGINE 100 UNIT/ML ~~LOC~~ SOLN: 35.0000 [IU] | Freq: Every day | SUBCUTANEOUS | 11 refills | Status: DC

## 2019-05-21 MED FILL — GABAPENTIN 300 MG CAPSULE: 300 | 30 days supply | Qty: 90 | Fill #0

## 2019-05-21 NOTE — Assessment & Plan Note (Signed)
Hyperlipidemia associated with type 2 diabetes now on atorvastatin

## 2019-05-21 NOTE — Assessment & Plan Note (Addendum)
I have made a referral to pain management he cannot achieve this until he achieves financial assistance  The patient understands I cannot prescribe opiates however I will give him a course of gabapentin 300 mg 3 times daily to see if this will assist in his back pain

## 2019-05-21 NOTE — Assessment & Plan Note (Signed)
Uncontrolled diabetes type 2 with hyperglycemia  Will increase Lantus to 35 units daily and continue metformin at 1000 mg twice daily and continue patient with our licensed clinical pharmacist today at this visit

## 2019-05-21 NOTE — Patient Instructions (Addendum)
A Pneumovax was given today  Increase citalopram to 40 mg daily you can take 2 of the 20 mg daily to last longer to refill the 40 mg tablet  Increase Lantus to 35 units daily  Trial gabapentin 300mg  three times daily for back pain  I have asked to get you a visit with Christa See our licensed clinical social worker for anxiety and depression and also to have her connect you with an opioid use clinic to give you medication assisted therapy is that she can come off the Percocet  Once you get the orange card we can get you into a pain clinic for alternatives to opioid use for pain  Return to see Dr. Joya Gaskins in 2 months

## 2019-05-21 NOTE — Assessment & Plan Note (Signed)
Severe anxiety and depression on citalopram will increase dose to 40 mg daily and reconnect patient with our licensed clinical social worker in particular to see if she can connect him with medication assisted therapy

## 2019-05-21 NOTE — Progress Notes (Signed)
Gas when he eats  Knot in center of stomach when going from lying to sitting  1 month f/u
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
## 2019-05-22 ENCOUNTER — Ambulatory Visit: Payer: Medicaid Other

## 2019-05-23 ENCOUNTER — Ambulatory Visit: Payer: Medicaid Other | Admitting: Licensed Clinical Social Worker

## 2019-05-28 ENCOUNTER — Ambulatory Visit: Payer: Medicaid Other

## 2019-05-30 ENCOUNTER — Other Ambulatory Visit: Payer: Self-pay

## 2019-05-30 ENCOUNTER — Ambulatory Visit: Payer: Self-pay

## 2019-05-30 ENCOUNTER — Ambulatory Visit: Payer: Medicaid Other

## 2019-06-01 ENCOUNTER — Encounter: Payer: Self-pay | Attending: Physical Medicine & Rehabilitation | Admitting: Physical Medicine and Rehabilitation

## 2019-06-04 MED FILL — ?CITALOPRAM HBR 20 MG TABLE: 20 | 30 days supply | Qty: 30 | Fill #2

## 2019-06-18 ENCOUNTER — Other Ambulatory Visit: Payer: Self-pay | Admitting: Critical Care Medicine

## 2019-06-18 ENCOUNTER — Ambulatory Visit: Payer: Self-pay | Attending: Family Medicine | Admitting: Pharmacist

## 2019-06-18 ENCOUNTER — Encounter: Payer: Self-pay | Admitting: Pharmacist

## 2019-06-18 ENCOUNTER — Other Ambulatory Visit: Payer: Self-pay

## 2019-06-18 DIAGNOSIS — E1165 Type 2 diabetes mellitus with hyperglycemia: Secondary | ICD-10-CM

## 2019-06-18 LAB — GLUCOSE, POCT (MANUAL RESULT ENTRY): POC Glucose: 168 mg/dl — AB (ref 70–99)

## 2019-06-18 MED ORDER — TRULICITY 0.75 MG/0.5ML ~~LOC~~ SOAJ: 0.7500 mg | SUBCUTANEOUS | 0 refills | Status: DC

## 2019-06-18 MED FILL — ?CITALOPRAM HBR 40 MG TABLE: 40 | 30 days supply | Qty: 30 | Fill #0

## 2019-06-18 MED FILL — metFORMIN HCL 1000 MG TABS: 1000 | 30 days supply | Qty: 60 | Fill #2

## 2019-06-18 MED FILL — TRUEplus LANCETS 28G MISC: 25 days supply | Qty: 100 | Fill #1

## 2019-06-18 MED FILL — TRULICITY 0.75 MG/0.5 ML PE: 0.75 | 28 days supply | Qty: 2 | Fill #0

## 2019-06-18 MED FILL — TRUE METRIX TEST STRIP: 25 days supply | Qty: 100 | Fill #1

## 2019-06-18 MED FILL — GABAPENTIN 300 MG CAPSULE: 300 | 30 days supply | Qty: 90 | Fill #1

## 2019-06-18 MED FILL — ?ATORVASTATIN 20 MG TABLET: 20 | 30 days supply | Qty: 30 | Fill #2

## 2019-06-18 NOTE — Progress Notes (Signed)
    S:    PCP: Dr. Joya Gaskins   No chief complaint on file.  Patient arrives in good spirits.  Presents for diabetes evaluation, education, and management Patient was referred and last seen by Primary Care Provider on 05/21/19. Glargine was commenced at that visit.     Patient reports DM was diagnosed in September of this year.   Family/Social History:  - FHx: HTN (mother) - Tobacco: former smoker  - Alcohol: only occasionally   Insurance coverage/medication affordability: Dnbi  Patient reports adherence with medications.  Current diabetes medications include: insulin glargine 35 units at bedtime, metformin 1000 mg daily  Current hypertension medications include: none  Current hyperlipidemia medications include: atorvastatin 20 mg daily   Patient denies hypoglycemic events.  Patient reported dietary habits:  - Working to improve diet but still has some dietary indiscretion (does  - Desires to improve this and lose weight   Patient-reported exercise habits:  - none outside of work d/t pain   Patient denies nocturia (nighttime urination).  Patient reports neuropathy (nerve pain). Patient denies visual changes. Patient reports self foot exams.    O:  POCT glucose: 168  Lab Results  Component Value Date   HGBA1C 9.8 (H) 03/29/2019   There were no vitals filed for this visit.  Lipid Panel     Component Value Date/Time   CHOL 169 04/16/2019 1129   TRIG 154 (H) 04/16/2019 1129   HDL 39 (L) 04/16/2019 1129   CHOLHDL 4.3 04/16/2019 1129   LDLCALC 103 (H) 04/16/2019 1129   Home fasting blood sugars: 140s-160s  2 hour post-meal/random blood sugars: low 200s (gives 1 specific reading of 207). These are reported. Pt did not bring in a meter to verify.   Clinical Atherosclerotic Cardiovascular Disease (ASCVD): No  The 10-year ASCVD risk score Mikey Bussing DC Jr., et al., 2013) is: 5%   Values used to calculate the score:     Age: 48 years     Sex: Male     Is Non-Hispanic  African American: No     Diabetic: Yes     Tobacco smoker: No     Systolic Blood Pressure: 0000000 mmHg     Is BP treated: No     HDL Cholesterol: 39 mg/dL     Total Cholesterol: 169 mg/dL   A/P: Diabetes diagnosed this year currently uncontrolled. Patient is able to verbalize appropriate hypoglycemia management plan. Patient is adherent with medication. Control is suboptimal due to dietary indiscretion and physical inactivity. He desires to lose some weight and would like to improve his glycemic control. I recommend weekly Trulicity. Patient with no personal or fhx of thyroid cancer and no reported hx of pancreatitis.  -Started Trulicity [id number] mg weekly. -Continued current dosing of insulin and metformin. -Extensively discussed pathophysiology of diabetes, recommended lifestyle interventions, dietary effects on blood sugar control -Counseled on s/sx of and management of hypoglycemia -Next A1C anticipated 06/2019.   ASCVD risk - primary prevention in patient with diabetes. Last LDL is not controlled. ASCVD risk score is not >20%  - moderate intensity statin indicated. -Continued atorvastatin 20 mg.   HM: UTD on PNA, tetanus, and influenza vaccines.   Written patient instructions provided. Total time in face to face counseling 30 minutes.   Follow up PCP Clinic Visit next month.     Benard Halsted, PharmD, [place] [phone]

## 2019-06-18 NOTE — Patient Instructions (Signed)
Thank you for coming to see me today. Please do the following:  1. Start using Trulicity once a week.  2. Continue 35 units of insulin daily.  3. Continue metformin but take after meals.  4. Continue checking blood sugars at home. 5. Continue making the lifestyle changes we've discussed together during our visit. Diet and exercise play a significant role in improving your blood sugars.  6. Follow-up with me .    Hypoglycemia or low blood sugar:   Low blood sugar can happen quickly and may become an emergency if not treated right away.   While this shouldn't happen often, it can be brought upon if you skip a meal or do not eat enough. Also, if your insulin or other diabetes medications are dosed too high, this can cause your blood sugar to go to low.   Warning signs of low blood sugar include: 1. Feeling shaky or dizzy 2. Feeling weak or tired  3. Excessive hunger 4. Feeling anxious or upset  5. Sweating even when you aren't exercising  What to do if I experience low blood sugar? 1. Check your blood sugar with your meter. If lower than 70, proceed to step 2.  2. Treat with 3-4 glucose tablets or 3 packets of regular sugar. If these aren't around, you can try hard candy. Yet another option would be to drink 4 ounces of fruit juice or 6 ounces of REGULAR soda.  3. Re-check your sugar in 15 minutes. If it is still below 70, do what you did in step 2 again. If has come back up, go ahead and eat a snack or small meal at this time.
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
## 2019-07-02 ENCOUNTER — Ambulatory Visit: Payer: Self-pay | Attending: Critical Care Medicine | Admitting: Critical Care Medicine

## 2019-07-02 ENCOUNTER — Encounter: Payer: Self-pay | Admitting: Critical Care Medicine

## 2019-07-02 ENCOUNTER — Other Ambulatory Visit: Payer: Self-pay

## 2019-07-02 VITALS — BP 135/84 | HR 65 | Temp 98.5°F | Resp 20 | Ht 72.0 in | Wt 272.0 lb

## 2019-07-02 DIAGNOSIS — M545 Low back pain, unspecified: Secondary | ICD-10-CM | POA: Insufficient documentation

## 2019-07-02 DIAGNOSIS — F32A Depression, unspecified: Secondary | ICD-10-CM

## 2019-07-02 DIAGNOSIS — Z794 Long term (current) use of insulin: Secondary | ICD-10-CM

## 2019-07-02 DIAGNOSIS — F419 Anxiety disorder, unspecified: Secondary | ICD-10-CM

## 2019-07-02 DIAGNOSIS — G894 Chronic pain syndrome: Secondary | ICD-10-CM

## 2019-07-02 DIAGNOSIS — E785 Hyperlipidemia, unspecified: Secondary | ICD-10-CM

## 2019-07-02 DIAGNOSIS — M25551 Pain in right hip: Secondary | ICD-10-CM

## 2019-07-02 DIAGNOSIS — F1120 Opioid dependence, uncomplicated: Secondary | ICD-10-CM | POA: Insufficient documentation

## 2019-07-02 DIAGNOSIS — F329 Major depressive disorder, single episode, unspecified: Secondary | ICD-10-CM

## 2019-07-02 DIAGNOSIS — E1169 Type 2 diabetes mellitus with other specified complication: Secondary | ICD-10-CM

## 2019-07-02 DIAGNOSIS — E119 Type 2 diabetes mellitus without complications: Secondary | ICD-10-CM

## 2019-07-02 DIAGNOSIS — G8929 Other chronic pain: Secondary | ICD-10-CM

## 2019-07-02 DIAGNOSIS — E1165 Type 2 diabetes mellitus with hyperglycemia: Secondary | ICD-10-CM

## 2019-07-02 DIAGNOSIS — F1129 Opioid dependence with unspecified opioid-induced disorder: Secondary | ICD-10-CM

## 2019-07-02 LAB — POCT GLYCOSYLATED HEMOGLOBIN (HGB A1C): Hemoglobin A1C: 6.1 % — AB (ref 4.0–5.6)

## 2019-07-02 LAB — GLUCOSE, POCT (MANUAL RESULT ENTRY): POC Glucose: 122 mg/dl — AB (ref 70–99)

## 2019-07-02 MED FILL — !VENTOLIN HFA INHALER: 108 (90 BAS | 25 days supply | Qty: 18 | Fill #0

## 2019-07-02 NOTE — Assessment & Plan Note (Signed)
Patient is now in atorvastatin 20 mg daily I would like to see response to this and will recheck lipid panel and hepatic function panel continue Lipitor

## 2019-07-02 NOTE — Patient Instructions (Signed)
Labs today include liver function and cholesterol levels  Referral to pain management clinic was sent  Imaging studies ordered including hip pelvis x-ray and lumbar MRI  Referral to opioid use clinic to help with medication assisted therapy for your withdrawal symptoms  Licensed clinical social worker will facilitate the referral and will be in touch with you to review opioid use clinic  Return to establish with primary care in late January early February  Keep up the good work on your diabetes your A1c is now down to 6.1 from 9.8

## 2019-07-02 NOTE — Assessment & Plan Note (Signed)
There is a complex features this patient that he is opioid dependent from long-term prescriptions from previous primary care providers and pain clinics  He would benefit from medication assisted therapy so we will connect with licensed clinical social work to see if we can get him referred

## 2019-07-02 NOTE — Assessment & Plan Note (Signed)
Marked improvement in hemoglobin A1c with blood sugar 122 today postprandial and A1c of 6.1  We will continue Trulicity weekly, Metformin 1000 mg twice daily, Lantus 35 units daily

## 2019-07-02 NOTE — Assessment & Plan Note (Addendum)
Chronic pain from lumbar disease we will reassess with MRI of the lumbar spine and pelvic and hip films  We will make referral to pain clinic for alternatives to opiates

## 2019-07-02 NOTE — Assessment & Plan Note (Signed)
As per chronic pain assessment

## 2019-07-03 ENCOUNTER — Telehealth: Payer: Self-pay | Admitting: Licensed Clinical Social Worker

## 2019-07-03 LAB — HEPATIC FUNCTION PANEL
ALT: 16 IU/L (ref 0–44)
AST: 16 IU/L (ref 0–40)
Albumin: 4.5 g/dL (ref 4.0–5.0)
Alkaline Phosphatase: 77 IU/L (ref 39–117)
Bilirubin Total: 0.7 mg/dL (ref 0.0–1.2)
Bilirubin, Direct: 0.18 mg/dL (ref 0.00–0.40)
Total Protein: 7.2 g/dL (ref 6.0–8.5)

## 2019-07-03 LAB — LIPID PANEL
Chol/HDL Ratio: 2.9 ratio (ref 0.0–5.0)
Cholesterol, Total: 112 mg/dL (ref 100–199)
HDL: 39 mg/dL — ABNORMAL LOW (ref >39)
LDL Chol Calc (NIH): 58 mg/dL (ref 0–99)
Triglycerides: 72 mg/dL (ref 0–149)
VLDL Cholesterol Cal: 15 mg/dL (ref 5–40)

## 2019-07-03 NOTE — Telephone Encounter (Signed)
Call placed to patient regarding IBH referral. LCSW left message requesting a return call.  

## 2019-07-04 ENCOUNTER — Telehealth: Payer: Self-pay | Admitting: Licensed Clinical Social Worker

## 2019-07-04 NOTE — Telephone Encounter (Signed)
MSW Intern placed call to patient to follow up regarding a referral for medication assisted treatment per Dr. Bettina Gavia request. MSW Intern informed patient of social worker's role at clinic.  Patient reports that he has been accepted for [facility] and has received an orange and blue card. Patient is waiting to be contacted from pain management referral placed 07/02/19.   Patient reports interest in MAT. MSW Intern provided patient contact information for [facility]. Pt reports he has used [facility] in the past and is unable to afford the weekly self-pay Suboxone costs. Patient reports he is not interested in Methadone (caused weight gain), but would prefer buprenorphine or Suboxone formulation. Pt has been approved for Medicaid family planning and is unable to afford a Freight forwarder. [facility] appears the only current option for MAT given financial constraint. Patient reports he has a friend who goes to a pain management clinic off of [address] to receive MAT. MSW Intern encouraged patient to find out if this clinic is within [facility] and accepts CFA.   Patient submitted an application for disability today. MSW Intern encouraged patient to contact her if he is in need of a Legal Aid referral to appeal a potential disability denial or further assistance navigating MAT options.

## 2019-07-05 ENCOUNTER — Telehealth (INDEPENDENT_AMBULATORY_CARE_PROVIDER_SITE_OTHER): Payer: Self-pay

## 2019-07-05 NOTE — Telephone Encounter (Signed)
Left voicemail informing patient that cholesterol is at goal and liver functions are normal. Instructed patient to stay on current dose of atorvastatin. Return call to CHW at [phone] with any questions or concerns. Nat Christen, CMA

## 2019-07-05 NOTE — Telephone Encounter (Signed)
-----   Message from Elsie Stain, MD sent at 07/03/2019  6:02 AM EST ----- Let mr Mark Ferrell know his cholesterol is at goal LDL 59   Liver functions normal   Stay on current dose of atorvastatin.

## 2019-07-11 ENCOUNTER — Other Ambulatory Visit: Payer: Self-pay | Admitting: Critical Care Medicine

## 2019-07-13 MED FILL — TRULICITY 0.75 MG/0.5 ML PE: 0.75 | 28 days supply | Qty: 2 | Fill #0

## 2019-07-30 MED FILL — TRUE METRIX TEST STRIP: 25 days supply | Qty: 100 | Fill #2

## 2019-07-30 MED FILL — ?CITALOPRAM HBR 40 MG TABLE: 40 | 30 days supply | Qty: 30 | Fill #1

## 2019-07-30 MED FILL — !LANTUS 100 UNITS/ML VIAL: 100 | 28 days supply | Qty: 10 | Fill #1

## 2019-07-30 MED FILL — metFORMIN HCL 1000 MG TABS: 1000 | 30 days supply | Qty: 60 | Fill #3

## 2019-07-30 MED FILL — TRULICITY 0.75 MG/0.5 ML PE: 0.75 | 28 days supply | Qty: 2 | Fill #1

## 2019-07-30 MED FILL — ?ATORVASTATIN 20 MG TABLET: 20 | 30 days supply | Qty: 30 | Fill #3

## 2019-07-30 MED FILL — TRUEplus LANCETS 28G MISC: 25 days supply | Qty: 100 | Fill #2

## 2019-08-10 ENCOUNTER — Ambulatory Visit (HOSPITAL_COMMUNITY): Admission: RE | Admit: 2019-08-10 | Payer: Self-pay | Source: Ambulatory Visit

## 2019-08-18 ENCOUNTER — Ambulatory Visit (HOSPITAL_COMMUNITY)
Admission: RE | Admit: 2019-08-18 | Discharge: 2019-08-18 | Disposition: A | Discharge status: Home / Self Care | Payer: Self-pay | Source: Ambulatory Visit | Attending: Critical Care Medicine | Admitting: Critical Care Medicine

## 2019-08-18 ENCOUNTER — Other Ambulatory Visit: Payer: Self-pay

## 2019-08-18 DIAGNOSIS — M545 Low back pain: Secondary | ICD-10-CM | POA: Insufficient documentation

## 2019-08-18 DIAGNOSIS — G894 Chronic pain syndrome: Secondary | ICD-10-CM | POA: Insufficient documentation

## 2019-08-18 DIAGNOSIS — M79604 Pain in right leg: Secondary | ICD-10-CM

## 2019-08-18 MED ORDER — GADOBUTROL 1 MMOL/ML IV SOLN
10.0000 mL | Freq: Once | INTRAVENOUS | Status: AC | PRN
  Administered 2019-08-18: 10 mL via INTRAVENOUS

## 2019-08-20 ENCOUNTER — Telehealth: Payer: Self-pay | Admitting: Licensed Clinical Social Worker

## 2019-08-20 ENCOUNTER — Telehealth: Payer: Self-pay | Admitting: Critical Care Medicine

## 2019-08-20 DIAGNOSIS — M545 Low back pain: Secondary | ICD-10-CM

## 2019-08-20 DIAGNOSIS — M79604 Pain in right leg: Secondary | ICD-10-CM

## 2019-08-20 NOTE — Telephone Encounter (Signed)
LCSW received consult from PCP to follow up with patient. LCSW placed call to patient, who shared that he is experiencing multiple psychosocial stressors resulting in feelings of overwhelm.   Pt reports that his priority is obtaining [facility] Financial Assistance Letter showing that benefits are active for his upcoming appointment with [facility]. LCSW consulted with Financial Counselor, Wynonia Hazard, who successfully updated pt's chart and mailed copy of CAFA approval to address on file.   Pt was appreciative for the support and agreed to schedule IBH appointment for Monday, February 1, 21. No additional concerns noted.

## 2019-08-20 NOTE — Telephone Encounter (Signed)
I connected this patient who had an MRI done recently of his spine.  He has lumbar 4 5 disc bulging but it is only to a mild degree.  There is no significant spinal cord impingement.  He does have likely some spurring that is causing some nerves coming out of the lumbar spine being affected.  The patient states he previously had injections in the spine did not help and is requesting more opiates.  I discussed with him that we will actually try to get him to medication assisted therapy for his chronic pain and get him off use of opiates for pain management he states opiates the only thing that helps him  He is not been to physical therapy in many years and I stated he should try physical therapy and a repeat referral to orthopedics first.  I also make a referral for him to the alcohol drug treatment services through our licensed clinical social worker  He will follow-up with his next scheduled office visit

## 2019-08-27 ENCOUNTER — Other Ambulatory Visit: Payer: Self-pay

## 2019-08-27 ENCOUNTER — Ambulatory Visit: Payer: Self-pay | Attending: Critical Care Medicine | Admitting: Licensed Clinical Social Worker

## 2019-08-27 DIAGNOSIS — F1129 Opioid dependence with unspecified opioid-induced disorder: Secondary | ICD-10-CM

## 2019-08-27 DIAGNOSIS — F419 Anxiety disorder, unspecified: Secondary | ICD-10-CM

## 2019-08-27 DIAGNOSIS — F332 Major depressive disorder, recurrent severe without psychotic features: Secondary | ICD-10-CM

## 2019-08-27 NOTE — BH Specialist Note (Signed)
[place] Visit via Telemedicine (Telephone)  08/27/2019 CULLEY PLANO BH:[id number]   Session Start time: 9:00 AM  Session End time: 9:30 AM Total time: 30  Referring Provider: Dr. Joya Gaskins Type of Visit: Telephonic Patient location: Home [facility] Provider location: Office All persons participating in visit: LCSW and Patient  Confirmed patient's address: Yes  Confirmed patient's phone number: Yes  Any changes to demographics: No   Confirmed patient's insurance: Yes  Any changes to patient's insurance: No   Discussed confidentiality: Yes    The following statements were read to the patient and/or legal guardian that are established with the [facility] Provider.  "The purpose of this phone visit is to provide behavioral health care while limiting exposure to the coronavirus (COVID19).  There is a possibility of technology failure and discussed alternative modes of communication if that failure occurs."  "By engaging in this telephone visit, you consent to the provision of healthcare.  Additionally, you authorize for your insurance to be billed for the services provided during this telephone visit."   Patient and/or legal guardian consented to telephone visit: Yes   PRESENTING CONCERNS: Patient and/or family reports the following symptoms/concerns: Pt reports increase in depression and anxiety triggered by difficulty managing chronic pain with hx of opioid use disorder Duration of problem: Ongoing; Severity of problem: severe  STRENGTHS (Protective Factors/Coping Skills): Pt receives strong support from family (wife and sister) Pt was approved for CAFA and receives FS Pt has a Chief Executive Officer to assist with disability appeal Pt has desire to change  GOALS ADDRESSED: Patient will: 1.  Reduce symptoms of: anxiety, depression and stress  2.  Increase knowledge and/or ability of: coping skills and healthy habits  3.  Demonstrate ability to: Increase healthy adjustment  to current life circumstances, Increase adequate support systems for patient/family and Increase motivation to adhere to plan of care  INTERVENTIONS: Interventions utilized:  Solution-Focused Strategies, Supportive Counseling, Psychoeducation and/or Health Education and Link to [employer] Standardized Assessments completed: Not Needed  ASSESSMENT: Patient currently experiencing increase in depression and anxiety triggered by difficulty managing chronic pain with hx of opioid use disorder. He receives strong support from family.  Patient may benefit from substance use treatment program to assist with opioid dependence. Pt was informed of various programs that provide suboxone/methadone clinics. Resources were emailed, per patient request.   PLAN: 1. Follow up with behavioral health clinician on : Follow up appt 09/10/2019 2. Behavioral recommendations: Utilize resources provided 3. Referral(s): [place] (In Clinic) and Substance Abuse Program  Rebekah [place], [place] 09/03/19 12:01 PM

## 2019-08-31 ENCOUNTER — Other Ambulatory Visit: Payer: Self-pay

## 2019-08-31 ENCOUNTER — Ambulatory Visit (INDEPENDENT_AMBULATORY_CARE_PROVIDER_SITE_OTHER): Payer: Self-pay | Admitting: Orthopaedic Surgery

## 2019-08-31 ENCOUNTER — Encounter: Payer: Self-pay | Admitting: Orthopaedic Surgery

## 2019-08-31 VITALS — BP 125/72 | HR 81 | Ht 72.0 in | Wt 272.0 lb

## 2019-08-31 DIAGNOSIS — F329 Major depressive disorder, single episode, unspecified: Secondary | ICD-10-CM

## 2019-08-31 DIAGNOSIS — M545 Low back pain: Secondary | ICD-10-CM

## 2019-08-31 DIAGNOSIS — F1129 Opioid dependence with unspecified opioid-induced disorder: Secondary | ICD-10-CM

## 2019-08-31 DIAGNOSIS — G8929 Other chronic pain: Secondary | ICD-10-CM

## 2019-08-31 DIAGNOSIS — F419 Anxiety disorder, unspecified: Secondary | ICD-10-CM
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
## 2019-09-06 ENCOUNTER — Encounter: Payer: Self-pay | Admitting: Family Medicine

## 2019-09-06 ENCOUNTER — Other Ambulatory Visit: Payer: Self-pay

## 2019-09-06 ENCOUNTER — Ambulatory Visit: Payer: Medicaid Other | Admitting: Family Medicine

## 2019-09-06 ENCOUNTER — Ambulatory Visit: Payer: Self-pay | Attending: Family Medicine | Admitting: Family Medicine

## 2019-09-06 VITALS — BP 125/81 | HR 80 | Temp 97.7°F | Resp 18 | Ht 72.0 in | Wt 280.0 lb

## 2019-09-06 DIAGNOSIS — Z8719 Personal history of other diseases of the digestive system: Secondary | ICD-10-CM

## 2019-09-06 DIAGNOSIS — M5441 Lumbago with sciatica, right side: Secondary | ICD-10-CM

## 2019-09-06 DIAGNOSIS — R1032 Left lower quadrant pain: Secondary | ICD-10-CM

## 2019-09-06 DIAGNOSIS — R110 Nausea: Secondary | ICD-10-CM

## 2019-09-06 DIAGNOSIS — G8929 Other chronic pain: Secondary | ICD-10-CM

## 2019-09-06 DIAGNOSIS — R1013 Epigastric pain: Secondary | ICD-10-CM

## 2019-09-06 DIAGNOSIS — M25561 Pain in right knee: Secondary | ICD-10-CM

## 2019-09-06 DIAGNOSIS — F32A Depression, unspecified: Secondary | ICD-10-CM

## 2019-09-06 DIAGNOSIS — F419 Anxiety disorder, unspecified: Secondary | ICD-10-CM

## 2019-09-06 DIAGNOSIS — E119 Type 2 diabetes mellitus without complications: Secondary | ICD-10-CM

## 2019-09-06 DIAGNOSIS — F329 Major depressive disorder, single episode, unspecified: Secondary | ICD-10-CM

## 2019-09-06 MED ORDER — CIPROFLOXACIN HCL 500 MG PO TABS: 500.0000 mg | ORAL_TABLET | Freq: Two times a day (BID) | ORAL | 0 refills | Status: AC

## 2019-09-06 MED ORDER — BUSPIRONE HCL 10 MG PO TABS: 10.0000 mg | ORAL_TABLET | Freq: Two times a day (BID) | ORAL | 1 refills | Status: DC

## 2019-09-06 MED ORDER — METRONIDAZOLE 500 MG PO TABS: 500.0000 mg | ORAL_TABLET | Freq: Two times a day (BID) | ORAL | 0 refills | Status: DC

## 2019-09-06 MED ORDER — OMEPRAZOLE 40 MG PO CPDR: 40.0000 mg | DELAYED_RELEASE_CAPSULE | Freq: Two times a day (BID) | ORAL | 3 refills | Status: DC

## 2019-09-06 MED FILL — CIPROFLOXACIN HCL 500 MG TA: 500 | 7 days supply | Qty: 14 | Fill #0

## 2019-09-06 MED FILL — ?ATORVASTATIN 20 MG TABLET: 20 | 30 days supply | Qty: 30 | Fill #4

## 2019-09-06 MED FILL — OMEPRAZOLE DR 40 MG CAPSULE: 40 | 30 days supply | Qty: 60 | Fill #0

## 2019-09-06 MED FILL — ?CITALOPRAM HBR 40 MG TABLE: 40 | 30 days supply | Qty: 30 | Fill #2

## 2019-09-06 MED FILL — !VENTOLIN HFA INHALER: 108 (90 BAS | 25 days supply | Qty: 18 | Fill #1

## 2019-09-06 MED FILL — ?BUSPIRONE HCL 10 MG TABS: 10 | 30 days supply | Qty: 60 | Fill #0

## 2019-09-06 MED FILL — $TRULICITY 0.75 MG/0.5 ML P: 0.75 | 28 days supply | Qty: 2 | Fill #2

## 2019-09-06 MED FILL — ?METRONIDAZOLE 500 MG TABS: 500 | 7 days supply | Qty: 14 | Fill #0

## 2019-09-06 NOTE — Patient Instructions (Signed)
Food Choices for Gastroesophageal Reflux Disease, Adult When you have gastroesophageal reflux disease (GERD), the foods you eat and your eating habits are very important. Choosing the right foods can help ease your discomfort. Think about working with a nutrition specialist (dietitian) to help you make good choices. What are tips for following this plan?  Meals  Choose healthy foods that are low in fat, such as fruits, vegetables, whole grains, low-fat dairy products, and lean meat, fish, and poultry.  Eat small meals often instead of 3 large meals a day. Eat your meals slowly, and in a place where you are relaxed. Avoid bending over or lying down until 2-3 hours after eating.  Avoid eating meals 2-3 hours before bed.  Avoid drinking a lot of liquid with meals.  Cook foods using methods other than frying. Bake, grill, or broil food instead.  Avoid or limit: ? Chocolate. ? Peppermint or spearmint. ? Alcohol. ? Pepper. ? Black and decaffeinated coffee. ? Black and decaffeinated tea. ? Bubbly (carbonated) soft drinks. ? Caffeinated energy drinks and soft drinks.  Limit high-fat foods such as: ? Fatty meat or fried foods. ? Whole milk, cream, butter, or ice cream. ? Nuts and nut butters. ? Pastries, donuts, and sweets made with butter or shortening.  Avoid foods that cause symptoms. These foods may be different for everyone. Common foods that cause symptoms include: ? Tomatoes. ? Oranges, lemons, and limes. ? Peppers. ? Spicy food. ? Onions and garlic. ? Vinegar. Lifestyle  Maintain a healthy weight. Ask your doctor what weight is healthy for you. If you need to lose weight, work with your doctor to do so safely.  Exercise for at least 30 minutes for 5 or more days each week, or as told by your doctor.  Wear loose-fitting clothes.  Do not smoke. If you need help quitting, ask your doctor.  Sleep with the head of your bed higher than your feet. Use a wedge under the  mattress or blocks under the bed frame to raise the head of the bed. Summary  When you have gastroesophageal reflux disease (GERD), food and lifestyle choices are very important in easing your symptoms.  Eat small meals often instead of 3 large meals a day. Eat your meals slowly, and in a place where you are relaxed.  Limit high-fat foods such as fatty meat or fried foods.  Avoid bending over or lying down until 2-3 hours after eating.  Avoid peppermint and spearmint, caffeine, alcohol, and chocolate. This information is not intended to replace advice given to you by your health care provider. Make sure you discuss any questions you have with your health care provider. Document Revised: 11/02/2018 Document Reviewed: 08/17/2016 Elsevier Patient Education  [place].  Diverticulitis  Diverticulitis is when small pockets in your large intestine (colon) get infected or swollen. This causes stomach pain and watery poop (diarrhea). These pouches are called diverticula. They form in people who have a condition called diverticulosis. Follow these instructions at home: Medicines  Take over-the-counter and prescription medicines only as told by your doctor. These include: ? Antibiotics. ? Pain medicines. ? Fiber pills. ? Probiotics. ? Stool softeners.  Do not drive or use heavy machinery while taking prescription pain medicine.  If you were prescribed an antibiotic, take it as told. Do not stop taking it even if you feel better. General instructions   Follow a diet as told by your doctor.  When you feel better, your doctor may tell you to  change your diet. You may need to eat a lot of fiber. Fiber makes it easier to poop (have bowel movements). Healthy foods with fiber include: ? Berries. ? Beans. ? Lentils. ? Green vegetables.  Exercise 3 or more times a week. Aim for 30 minutes each time. Exercise enough to sweat and make your heart beat faster.  Keep all follow-up  visits as told. This is important. You may need to have an exam of the large intestine. This is called a colonoscopy. Contact a doctor if:  Your pain does not get better.  You have a hard time eating or drinking.  You are not pooping like normal. Get help right away if:  Your pain gets worse.  Your problems do not get better.  Your problems get worse very fast.  You have a fever.  You throw up (vomit) more than one time.  You have poop that is: ? Bloody. ? Black. ? Tarry. Summary  Diverticulitis is when small pockets in your large intestine (colon) get infected or swollen.  Take medicines only as told by your doctor.  Follow a diet as told by your doctor. This information is not intended to replace advice given to you by your health care provider. Make sure you discuss any questions you have with your health care provider. Document Revised: 06/24/2017 Document Reviewed: 07/29/2016 Elsevier Patient Education  [place].

## 2019-09-06 NOTE — Progress Notes (Signed)
Patient complains of chronic lower back and front right thigh pain. Patient complains of left knee increasing in pain and throbbing when walking. Patient complains of not being able to squat down without having a device to help him up. Patient complains of abdominal swelling and vomiting one night in his sleep. Patient suffers from diverticulitis.
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
## 2019-09-08 LAB — CBC WITH DIFFERENTIAL/PLATELET
Basophils Absolute: 0 x10E3/uL (ref 0.0–0.2)
Basos: 0 %
EOS (ABSOLUTE): 0.5 x10E3/uL — ABNORMAL HIGH (ref 0.0–0.4)
Eos: 7 %
Hematocrit: 42 % (ref 37.5–51.0)
Hemoglobin: 14.1 g/dL (ref 13.0–17.7)
Immature Grans (Abs): 0 x10E3/uL (ref 0.0–0.1)
Immature Granulocytes: 0 %
Lymphocytes Absolute: 1.6 x10E3/uL (ref 0.7–3.1)
Lymphs: 20 %
MCH: 29.8 pg (ref 26.6–33.0)
MCHC: 33.6 g/dL (ref 31.5–35.7)
MCV: 89 fL (ref 79–97)
Monocytes Absolute: 0.4 x10E3/uL (ref 0.1–0.9)
Monocytes: 6 %
Neutrophils Absolute: 5.2 x10E3/uL (ref 1.4–7.0)
Neutrophils: 67 %
Platelets: 215 x10E3/uL (ref 150–450)
RBC: 4.73 x10E6/uL (ref 4.14–5.80)
RDW: 12.1 % (ref 11.6–15.4)
WBC: 7.7 x10E3/uL (ref 3.4–10.8)

## 2019-09-08 LAB — COMPREHENSIVE METABOLIC PANEL WITH GFR
ALT: 17 IU/L (ref 0–44)
AST: 14 IU/L (ref 0–40)
Albumin/Globulin Ratio: 1.4 (ref 1.2–2.2)
Albumin: 3.9 g/dL — ABNORMAL LOW (ref 4.0–5.0)
Alkaline Phosphatase: 89 IU/L (ref 39–117)
BUN/Creatinine Ratio: 16 (ref 9–20)
BUN: 12 mg/dL (ref 6–24)
Bilirubin Total: 0.7 mg/dL (ref 0.0–1.2)
CO2: 24 mmol/L (ref 20–29)
Calcium: 8.9 mg/dL (ref 8.7–10.2)
Chloride: 98 mmol/L (ref 96–106)
Creatinine, Ser: 0.74 mg/dL — ABNORMAL LOW (ref 0.76–1.27)
GFR calc Af Amer: 128 mL/min/1.73
GFR calc non Af Amer: 111 mL/min/1.73
Globulin, Total: 2.7 g/dL (ref 1.5–4.5)
Glucose: 117 mg/dL — ABNORMAL HIGH (ref 65–99)
Potassium: 4 mmol/L (ref 3.5–5.2)
Sodium: 140 mmol/L (ref 134–144)
Total Protein: 6.6 g/dL (ref 6.0–8.5)

## 2019-09-08 LAB — H PYLORI, IGM, IGG, IGA AB
H pylori, IgM Abs: <9 U (ref 0.0–8.9)
H. pylori, IgA Abs: <9 U (ref 0.0–8.9)
H. pylori, IgG AbS: 0.33 {index_val} (ref 0.00–0.79)

## 2019-09-10 ENCOUNTER — Telehealth: Payer: Self-pay | Admitting: Licensed Clinical Social Worker

## 2019-09-10 ENCOUNTER — Other Ambulatory Visit: Payer: Self-pay

## 2019-09-10 ENCOUNTER — Ambulatory Visit: Payer: Self-pay | Attending: Critical Care Medicine | Admitting: Licensed Clinical Social Worker

## 2019-09-10 NOTE — Telephone Encounter (Signed)
Call placed to patient regarding scheduled [facility] follow up appointment. LCSW left message requesting a return call.

## 2019-09-11 ENCOUNTER — Ambulatory Visit: Payer: Medicaid Other | Admitting: Orthopaedic Surgery

## 2019-09-11 ENCOUNTER — Telehealth (INDEPENDENT_AMBULATORY_CARE_PROVIDER_SITE_OTHER): Payer: Self-pay

## 2019-09-11 NOTE — Telephone Encounter (Signed)
-----   Message from Antony Blackbird, MD sent at 09/08/2019  4:10 PM EST ----- Normal complete blood count.  Negative antibody blood test for H. pylori which is a bacteria that can cause stomach ulcers.  Glucose of 117 on comprehensive metabolic panel and otherwise normal electrolytes and liver enzymes are within normal

## 2019-09-11 NOTE — Telephone Encounter (Signed)
Patient verified date of birth. He is aware that his CBC was normal. H pylori test negative. Glucose at time of blood draw elevated at 117. Electrolytes and liver enzymes normal. He verbalized understanding. Nat Christen, CMA

## 2019-09-19 MED FILL — !LANTUS 100 UNITS/ML VIAL: 100 | 28 days supply | Qty: 10 | Fill #2

## 2019-09-27 ENCOUNTER — Other Ambulatory Visit: Payer: Self-pay | Admitting: Family Medicine

## 2019-09-27 ENCOUNTER — Other Ambulatory Visit: Payer: Self-pay

## 2019-09-27 ENCOUNTER — Ambulatory Visit: Payer: Self-pay | Attending: Critical Care Medicine | Admitting: Licensed Clinical Social Worker

## 2019-09-27 ENCOUNTER — Encounter: Payer: Self-pay | Admitting: Family Medicine

## 2019-09-27 ENCOUNTER — Ambulatory Visit: Payer: Self-pay | Attending: Family Medicine | Admitting: Family Medicine

## 2019-09-27 VITALS — BP 116/74 | HR 76 | Temp 98.1°F | Resp 20 | Wt 288.0 lb

## 2019-09-27 DIAGNOSIS — F32A Depression, unspecified: Secondary | ICD-10-CM

## 2019-09-27 DIAGNOSIS — F419 Anxiety disorder, unspecified: Secondary | ICD-10-CM

## 2019-09-27 DIAGNOSIS — F1124 Opioid dependence with opioid-induced mood disorder: Secondary | ICD-10-CM

## 2019-09-27 DIAGNOSIS — F332 Major depressive disorder, recurrent severe without psychotic features: Secondary | ICD-10-CM

## 2019-09-27 DIAGNOSIS — G894 Chronic pain syndrome: Secondary | ICD-10-CM

## 2019-09-27 DIAGNOSIS — M545 Low back pain, unspecified: Secondary | ICD-10-CM

## 2019-09-27 DIAGNOSIS — K5903 Drug induced constipation: Secondary | ICD-10-CM

## 2019-09-27 DIAGNOSIS — J449 Chronic obstructive pulmonary disease, unspecified: Secondary | ICD-10-CM

## 2019-09-27 DIAGNOSIS — R110 Nausea: Secondary | ICD-10-CM

## 2019-09-27 DIAGNOSIS — F329 Major depressive disorder, single episode, unspecified: Secondary | ICD-10-CM

## 2019-09-27 DIAGNOSIS — R1032 Left lower quadrant pain: Secondary | ICD-10-CM

## 2019-09-27 DIAGNOSIS — Z8719 Personal history of other diseases of the digestive system: Secondary | ICD-10-CM

## 2019-09-27 DIAGNOSIS — H539 Unspecified visual disturbance: Secondary | ICD-10-CM

## 2019-09-27 DIAGNOSIS — M79604 Pain in right leg: Secondary | ICD-10-CM

## 2019-09-27 DIAGNOSIS — R1013 Epigastric pain: Secondary | ICD-10-CM

## 2019-09-27 MED ORDER — BUSPIRONE HCL 10 MG PO TABS: 10.0000 mg | ORAL_TABLET | Freq: Three times a day (TID) | ORAL | 3 refills | Status: DC

## 2019-09-27 MED ORDER — LACTULOSE 20 GM/30ML PO SOLN: ORAL | 4 refills | Status: DC

## 2019-09-27 MED ORDER — ALBUTEROL SULFATE HFA 108 (90 BASE) MCG/ACT IN AERS: 2.0000 | INHALATION_SPRAY | Freq: Four times a day (QID) | RESPIRATORY_TRACT | 5 refills | Status: DC | PRN

## 2019-09-27 MED FILL — LACTULOSE 10 GM/15 ML SOLN: 10 | 17 days supply | Qty: 946 | Fill #0

## 2019-09-27 MED FILL — ALBUTEROL SULFATE HFA 108 (: 108 (90 BAS | 25 days supply | Qty: 18 | Fill #0

## 2019-09-27 MED FILL — ?BUSPIRONE HCL 10 MG TABS: 10 | 27 days supply | Qty: 82 | Fill #0

## 2019-09-27 NOTE — Progress Notes (Signed)
Pain 8/10 all over Right leg, pelvis. Knee, lower back, left knee,   Right shoulder has Limited ROM Right costal  Concerns for hernia today.  Would like medication for pain.  Was referred to [facility] clinic but would like a referral to pain clinic.   Having problems with vision
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
## 2019-09-28 LAB — COMPREHENSIVE METABOLIC PANEL WITH GFR
ALT: 18 IU/L (ref 0–44)
AST: 22 IU/L (ref 0–40)
Albumin/Globulin Ratio: 1.6 (ref 1.2–2.2)
Albumin: 4.2 g/dL (ref 4.0–5.0)
Alkaline Phosphatase: 84 IU/L (ref 39–117)
BUN/Creatinine Ratio: 19 (ref 9–20)
BUN: 17 mg/dL (ref 6–24)
Bilirubin Total: 0.3 mg/dL (ref 0.0–1.2)
CO2: 27 mmol/L (ref 20–29)
Calcium: 9.4 mg/dL (ref 8.7–10.2)
Chloride: 100 mmol/L (ref 96–106)
Creatinine, Ser: 0.89 mg/dL (ref 0.76–1.27)
GFR calc Af Amer: 119 mL/min/1.73
GFR calc non Af Amer: 103 mL/min/1.73
Globulin, Total: 2.7 g/dL (ref 1.5–4.5)
Glucose: 99 mg/dL (ref 65–99)
Potassium: 4.7 mmol/L (ref 3.5–5.2)
Sodium: 141 mmol/L (ref 134–144)
Total Protein: 6.9 g/dL (ref 6.0–8.5)

## 2019-09-28 LAB — LIPASE: Lipase: 19 U/L (ref 13–78)

## 2019-09-30 ENCOUNTER — Encounter: Payer: Self-pay | Admitting: Family Medicine

## 2019-10-05 ENCOUNTER — Other Ambulatory Visit: Payer: Self-pay | Admitting: Critical Care Medicine

## 2019-10-08 ENCOUNTER — Ambulatory Visit (HOSPITAL_COMMUNITY)
Admission: RE | Admit: 2019-10-08 | Discharge: 2019-10-08 | Disposition: A | Discharge status: Home / Self Care | Payer: Self-pay | Source: Ambulatory Visit | Attending: Family Medicine | Admitting: Family Medicine

## 2019-10-08 ENCOUNTER — Other Ambulatory Visit: Payer: Self-pay | Admitting: Family Medicine

## 2019-10-08 ENCOUNTER — Other Ambulatory Visit: Payer: Self-pay

## 2019-10-08 ENCOUNTER — Other Ambulatory Visit: Payer: Self-pay | Admitting: Pharmacist

## 2019-10-08 DIAGNOSIS — R1032 Left lower quadrant pain: Secondary | ICD-10-CM

## 2019-10-08 MED ORDER — METFORMIN HCL 1000 MG PO TABS: 1000.0000 mg | ORAL_TABLET | Freq: Two times a day (BID) | ORAL | 2 refills | Status: DC

## 2019-10-08 MED ORDER — IOHEXOL 300 MG/ML  SOLN
100.0000 mL | Freq: Once | INTRAMUSCULAR | Status: AC | PRN
  Administered 2019-10-08: 100 mL via INTRAVENOUS

## 2019-10-08 MED FILL — metFORMIN HCL 1000 MG TABS: 1000 | 30 days supply | Qty: 60 | Fill #0

## 2019-10-09 ENCOUNTER — Other Ambulatory Visit: Payer: Self-pay | Admitting: Nurse Practitioner

## 2019-10-09 ENCOUNTER — Telehealth: Payer: Self-pay | Admitting: Family Medicine

## 2019-10-09 ENCOUNTER — Encounter: Payer: Self-pay | Admitting: Gastroenterology

## 2019-10-09 DIAGNOSIS — K5790 Diverticulosis of intestine, part unspecified, without perforation or abscess without bleeding: Secondary | ICD-10-CM

## 2019-10-09 NOTE — Telephone Encounter (Signed)
Santiago Glad called from a nurse line to report that a stat order for labs were needed for the patient. She stated to please call Diane at [facility] [phone] for more information.

## 2019-10-09 NOTE — Telephone Encounter (Signed)
The report was given to provider [facility].

## 2019-10-15 ENCOUNTER — Other Ambulatory Visit: Payer: Self-pay | Admitting: Critical Care Medicine

## 2019-10-15 MED FILL — ?ATORVASTATIN 20 MG TABLET: 20 | 30 days supply | Qty: 30 | Fill #5

## 2019-10-15 MED FILL — ?CITALOPRAM HBR 40 MG TABLE: 40 | 30 days supply | Qty: 30 | Fill #3

## 2019-10-15 MED FILL — LANTUS 100 UNITS/ML VIAL: 100 | 28 days supply | Qty: 10 | Fill #3

## 2019-10-16 MED FILL — $TRULICITY 0.75 MG/0.5 ML P: 0.75 | 30 days supply | Qty: 2 | Fill #0

## 2019-11-02 ENCOUNTER — Ambulatory Visit: Payer: Medicaid Other | Admitting: Family Medicine

## 2019-11-04 NOTE — BH Specialist Note (Signed)
Integrated Behavioral Health Initial Visit  MRN: [id number] Name: SOLOMONE TYRIE  Number of [place] Clinician visits:: 1/6 Session Start time: 11:00 AM  Session End time: 11:30 AM Total time: 30  Type of Service: [place] Interpretor:No. Interpretor Name and Language: NA   Warm Hand Off Completed.       SUBJECTIVE: Mark Ferrell is a 49 y.o. male accompanied by self Patient was referred by Dr. Chapman Fitch for substance use. Patient reports the following symptoms/concerns: Pt reports difficulty obtaining substance use treatment  Duration of problem: Ongoing; Severity of problem: moderate  OBJECTIVE: Mood: Anxious and Irritable and Affect: Appropriate Risk of harm to self or others: No plan to harm self or others  LIFE CONTEXT: Family and Social: Pt receives support from family School/Work: Pt has a pending disability claim Self-Care: Pt reports substance use Life Changes: Pt has difficulty managing mental health triggered by chronic pain  GOALS ADDRESSED: Patient will: 1. Reduce symptoms of: agitation, anxiety, depression and stress 2. Increase knowledge and/or ability of: self-management skills  3. Demonstrate ability to: Increase healthy adjustment to current life circumstances, Increase adequate support systems for patient/family, Increase motivation to adhere to plan of care and Decrease self-medicating behaviors  INTERVENTIONS: Interventions utilized: Solution-Focused Strategies, Supportive Counseling, Psychoeducation and/or Health Education and Link to [employer]  Standardized Assessments completed: GAD-7 and PHQ 2&9  ASSESSMENT: Patient currently experiencing difficulty managing mental health triggered by chronic pain with hx of opioid use disorder. He receives strong support from family.  Patient may benefit from substance use treatment program to assist with opioid dependence. Pt reports resources provided  at previous session with LCSW did not work with his schedule. Pt does not want to check-in with agency daily to continue suboxone treatment. Pt was provided additional resources.   PLAN: 1. Follow up with behavioral health clinician on : Contact LCSW with additional behavioral health and/or resource needs 2. Behavioral recommendations: Utilize resources provided 3. Referral(s): Community Resources:  substance use 4. "From scale of 1-10, how likely are you to follow plan?":   Rebekah Chesterfield, LCSW 11/04/19 12:10 AM

## 2019-11-14 ENCOUNTER — Ambulatory Visit: Payer: Medicaid Other | Attending: Family Medicine | Admitting: Physician Assistant

## 2019-11-14 ENCOUNTER — Other Ambulatory Visit: Payer: Self-pay | Admitting: Physician Assistant

## 2019-11-14 ENCOUNTER — Other Ambulatory Visit: Payer: Self-pay

## 2019-11-14 DIAGNOSIS — F1129 Opioid dependence with unspecified opioid-induced disorder: Secondary | ICD-10-CM

## 2019-11-14 DIAGNOSIS — F32A Depression, unspecified: Secondary | ICD-10-CM

## 2019-11-14 DIAGNOSIS — Z794 Long term (current) use of insulin: Secondary | ICD-10-CM

## 2019-11-14 DIAGNOSIS — F329 Major depressive disorder, single episode, unspecified: Secondary | ICD-10-CM

## 2019-11-14 DIAGNOSIS — E119 Type 2 diabetes mellitus without complications: Secondary | ICD-10-CM

## 2019-11-14 DIAGNOSIS — F419 Anxiety disorder, unspecified: Secondary | ICD-10-CM

## 2019-11-14 MED ORDER — INSULIN SYRINGES (DISPOSABLE) U-100 0.3 ML MISC: 1.0000 | Freq: Once | 3 refills | Status: AC

## 2019-11-14 MED ORDER — BUSPIRONE HCL 15 MG PO TABS: 15.0000 mg | ORAL_TABLET | Freq: Three times a day (TID) | ORAL | 3 refills | Status: DC

## 2019-11-14 MED ORDER — METFORMIN HCL 1000 MG PO TABS: 1000.0000 mg | ORAL_TABLET | Freq: Two times a day (BID) | ORAL | 2 refills | Status: DC

## 2019-11-14 MED ORDER — TRULICITY 0.75 MG/0.5ML ~~LOC~~ SOAJ: 0.7500 mg | SUBCUTANEOUS | 2 refills | Status: DC

## 2019-11-14 MED ORDER — CITALOPRAM HYDROBROMIDE 40 MG PO TABS: 40.0000 mg | ORAL_TABLET | Freq: Every day | ORAL | 3 refills | Status: DC

## 2019-11-14 MED ORDER — INSULIN GLARGINE 100 UNIT/ML ~~LOC~~ SOLN: 35.0000 [IU] | Freq: Every day | SUBCUTANEOUS | 11 refills | Status: DC

## 2019-11-14 MED FILL — $TRULICITY 0.75 MG/0.5 ML P: 0.75 | 28 days supply | Qty: 2 | Fill #0

## 2019-11-14 MED FILL — busPIRone HCL 15 MG TABS: 15 | 30 days supply | Qty: 90 | Fill #0

## 2019-11-14 MED FILL — !LANTUS 100 UNITS/ML VIAL: 100 | 28 days supply | Qty: 10 | Fill #0

## 2019-11-14 MED FILL — metFORMIN HCL 1000 MG TABS: 1000 | 30 days supply | Qty: 60 | Fill #0

## 2019-11-14 MED FILL — ?CITALOPRAM HBR 40 MG TABLE: 40 | 30 days supply | Qty: 30 | Fill #0

## 2019-11-14 MED FILL — TRUEPLUS SYR 0.3ML 31GX5/16: 31G X 5/16" | 100 days supply | Qty: 100 | Fill #0

## 2019-11-14 NOTE — Progress Notes (Signed)
Patient ID: Mark Ferrell, male   DOB: [birth year], 49 y.o.   MRN: [id number] Virtual Visit via Telephone Note  I connected with Mark Ferrell on 11/14/19 at  9:30 AM EDT by telephone and verified that I am speaking with the correct person using two identifiers.   I discussed the limitations, risks, security and privacy concerns of performing an evaluation and management service by telephone and the availability of in person appointments. I also discussed with the patient that there may be a patient responsible charge related to this service. The patient expressed understanding and agreed to proceed.  PATIENT visit by telephone virtually in the context of Covid-19 pandemic. Patient location:  home My Location:  [place] office Persons on the call:  Me and the patient  History of Present Illness: Patient has appt next week with GI.  Still having intermittent abdominal pain.  CT abdomen/pelvis:  IMPRESSION: 1. Multiple diverticula noted in the descending and sigmoid colon regions with wall thickening in the mid sigmoid colon region. This wall thickening may be indicative of muscular hypertrophy secondary to diverticulosis. Earliest changes of diverticulitis cannot be excluded in this circumstance. Note that there is no surrounding soft tissue stranding or fluid. No demonstrable abscess or perforation.  2. No bowel obstruction. Appendix appears normal. No evident abscess in the abdomen pelvis.  3. There is a small amount of fat in the umbilicus. Fat is noted in each inguinal ring. No bowel containing hernia demonstrable.  4. No evident renal or ureteral calculus. No hydronephrosis. Urinary bladder wall thickness normal.  5.  Aortic Atherosclerosis (ICD10-I70.0).  Has not seen pain specialist yet bc still no financial assistance.  So, he has been buying meds off the street(which I advised against).  Still having anxiety.  Buspar not helping much.  Previously on xanax, clonazepam, valium.      Observations/Objective:  NAD.  A&Ox3   Assessment and Plan: 1. Anxiety and depression Will increase dose - busPIRone (BUSPAR) 15 MG tablet; Take 1 tablet (15 mg total) by mouth 3 (three) times daily.  Dispense: 90 tablet; Refill: 3 - Ambulatory referral to Social Work  2. Controlled type 2 diabetes mellitus without complication, with long-term current use of insulin ([place]) Controlled currently.  Blood sugars running <130  3. Opioid dependence with opioid-induced disorder ([place]) -consider [facility] or other treatment facility-patient not interested - Ambulatory referral to Social Work    Follow Up Instructions: See PCP in 1 month   I discussed the assessment and treatment plan with the patient. The patient was provided an opportunity to ask questions and all were answered. The patient agreed with the plan and demonstrated an understanding of the instructions.   The patient was advised to call back or seek an in-person evaluation if the symptoms worsen or if the condition fails to improve as anticipated.  I provided 16 minutes of non-face-to-face time during this encounter.   Mark Caldron, PA-C

## 2019-11-20 ENCOUNTER — Encounter: Payer: Self-pay | Admitting: Gastroenterology

## 2019-11-20 ENCOUNTER — Other Ambulatory Visit: Payer: Self-pay

## 2019-11-20 ENCOUNTER — Ambulatory Visit (INDEPENDENT_AMBULATORY_CARE_PROVIDER_SITE_OTHER): Payer: Self-pay | Admitting: Gastroenterology

## 2019-11-20 VITALS — BP 142/80 | HR 66 | Temp 97.7°F | Ht 71.5 in | Wt 276.0 lb

## 2019-11-20 DIAGNOSIS — Z794 Long term (current) use of insulin: Secondary | ICD-10-CM

## 2019-11-20 DIAGNOSIS — R140 Abdominal distension (gaseous): Secondary | ICD-10-CM

## 2019-11-20 DIAGNOSIS — K5731 Diverticulosis of large intestine without perforation or abscess with bleeding: Secondary | ICD-10-CM

## 2019-11-20 DIAGNOSIS — R1310 Dysphagia, unspecified: Secondary | ICD-10-CM

## 2019-11-20 DIAGNOSIS — E119 Type 2 diabetes mellitus without complications: Secondary | ICD-10-CM

## 2019-11-20 DIAGNOSIS — Z01818 Encounter for other preprocedural examination: Secondary | ICD-10-CM

## 2019-11-20 DIAGNOSIS — R109 Unspecified abdominal pain: Secondary | ICD-10-CM

## 2019-11-20 MED ORDER — NA SULFATE-K SULFATE-MG SULF 17.5-3.13-1.6 GM/177ML PO SOLN: ORAL | 0 refills | Status: DC

## 2019-11-20 MED ORDER — RIFAXIMIN 550 MG PO TABS
550.0000 mg | ORAL_TABLET | Freq: Three times a day (TID) | ORAL | 0 refills | Status: AC
Start: 2019-11-20 — End: 2019-11-25

## 2019-11-20 NOTE — Patient Instructions (Addendum)
Continue to take your omeprazole twice daily.  I am recommending a trial of antibiotics to reset your gut bacteria in the event that this is causing your recent symptoms. It can be an expensive medication if not covered by your insurance - but let's see if we can get it approved.  We have sent the following medications to your pharmacy for you to pick up at your convenience: [facility] will contact you regarding this prescription.  I have recommended an upper endoscopy and a colonoscopy for further evaluation of your symptoms.   You have been scheduled for an endoscopy and colonoscopy. Please follow the written instructions given to you at your visit today. Please pick up your prep supplies at the pharmacy within the next 1-3 days. If you use inhalers (even only as needed), please bring them with you on the day of your procedure. Your physician has requested that you go to www.startemmi.com and enter the access code given to you at your visit today. This web site gives a general overview about your procedure. However, you should still follow specific instructions given to you by our office regarding your preparation for the procedure.   Thank you for trusting me with your gastrointestinal care!    Thornton Park, MD, MPH   If you are age 2 or older, your body mass index should be between 23-30. Your Body mass index is 37.96 kg/m. If this is out of the aforementioned range listed, please consider follow up with your Primary Care Provider.  If you are age 79 or younger, your body mass index should be between 19-25. Your Body mass index is 37.96 kg/m. If this is out of the aformentioned range listed, please consider follow up with your Primary Care Provider.

## 2019-11-20 NOTE — Progress Notes (Signed)
Referring Provider: Antony Blackbird, MD Primary Care Physician:  Antony Blackbird, MD  Reason for Consultation: Diverticulosis   IMPRESSION:  Abdominal pain Gas/Bloating with eructation Intermittent dysphagia to solids Abnormal CT scan No prior colon cancer screening History of diverticulitis (patient reported) No known family history of colon cancer or polyps  Recent symptoms may be side effects of metformin and/or Trulicity given temporal onset, recent improvement. If the symptoms are medication induced they will continue to improve with time.  Must consider symptomatic gallbladder disease, which can be increased with Trulicity as well as gastroparesis, reflux esophagitis, gastritis, H pylori, SIBO, and celaic. Given this differential I am recommending a trial of Xifaxan and endoscopic evaluation with esophageal, gastric, and duodenal biopsies.  He is due colonoscopy for colon cancer screening.    PLAN: Continue omeprazole 40 mg BID Xifaxan 550 mg TID x 2 weeks EGD and Colonoscopy  Will proceed with endoscopy. The nature of the procedure, as well as the risks, benefits, and alternatives were carefully and thoroughly reviewed with the patient. Ample time for discussion and questions allowed. The patient understood, was satisfied, and agreed to proceed.    HPI: Mark Ferrell is a 49 y.o. male referred by Dr. Chapman Fitch for further evaluation of abdominal pain and an abnormal CT scan.  He has anxiety and depression on BuSpar, type 2 diabetes mellitus requiring insulin, COPD, hypertension, and opioid dependence for chronic pain especially in his lower back. 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   Has a history of diverticulitis occurring over 10 years ago and was told that he would need surgery due to recurrent symptoms. He feels like he knows when the diverticulitis is acting up.   Is seen today reporting several months of intermittnet upper abdominal pain that fluctuates in  intensity. Can be severe for 40-60 minutes.  Associated gas, burping, belching, bloating, early satiety, nausea.  No identified triggers.  No identified exacerbating features except for his diabetes medications.  Temporal association of symptoms with onset of use of metformin and Trulicity. Not change by eating, defecation, or position.  When symptoms start, they are worsened by his anxiety.  Over the last few weeks he has started to feels better.  On omeprazole 40 mg BID x 2 months has helped but not resolved his symptoms. No effect on bloating.  Dysphagia to solids x 2 over the last month. Passes a food bolus with relaxing and drinking water.  Weight stable.  Chronic constipation attributed to chronic opioid use.  There is no blood or mucus in the stool.  Labs 09/27/19 showed normal CMP. CB was normal 09/06/19.  CT the abdomen and pelvis with contrast 10/08/2019 showed multiple left-sided diverticulosis with wall thickening in the mid sigmoid colon.  There is no soft tissue stranding or fluid collection.  The radiologist suggested the wall thickening may be indicative of muscular hypertrophy secondary to the diverticulosis.  There was no evidence for bowel obstruction.  Small amount of fat was noted in the umbilicus.  No prior colonoscopy or endoscopic evaluation.  No known family history of colon cancer or polyps. No family history of uterine/endometrial cancer, pancreatic cancer or gastric/stomach cancer.   Past Medical History:  Diagnosis Date  . Anxiety   . Diabetes mellitus without complication ([place]) 48/54/6270  . Hypertension     Past Surgical History:  Procedure Laterality Date  . BONY PELVIS SURGERY      Current Outpatient Medications  Medication Sig Dispense Refill  .  albuterol (VENTOLIN HFA) 108 (90 Base) MCG/ACT inhaler Inhale 2 puffs into the lungs every 6 (six) hours as needed for wheezing or shortness of breath. 18 g 5  . atorvastatin (LIPITOR) 20 MG tablet Take 1  tablet (20 mg total) by mouth daily. 90 tablet 3  . blood glucose meter kit and supplies KIT Dispense based on patient and insurance preference. Use up to four times daily as directed. (FOR ICD-9 250.00, 250.01). 1 each 0  . busPIRone (BUSPAR) 15 MG tablet Take 1 tablet (15 mg total) by mouth 3 (three) times daily. 90 tablet 3  . citalopram (CELEXA) 40 MG tablet Take 1 tablet (40 mg total) by mouth daily. 30 tablet 3  . Dulaglutide (TRULICITY) 5.78 IO/9.6EX SOPN Inject 0.75 mg into the skin once a week. 2 mL 2  . glucose blood (TRUE METRIX BLOOD GLUCOSE TEST) test strip Use as instructed 100 each 12  . insulin glargine (LANTUS) 100 UNIT/ML injection Inject 0.35 mLs (35 Units total) into the skin at bedtime. 10 mL 11  . Lactulose 20 GM/30ML SOLN Take 30 ml at bedtime and up to twice daily as needed 450 mL 4  . metFORMIN (GLUCOPHAGE) 1000 MG tablet Take 1 tablet (1,000 mg total) by mouth 2 (two) times daily with a meal. 60 tablet 2  . omeprazole (PRILOSEC) 40 MG capsule Take 1 capsule (40 mg total) by mouth 2 (two) times daily. 60 capsule 3  . oxyCODONE-acetaminophen (PERCOCET) 10-325 MG per tablet Take 1 tablet by mouth every 4 (four) hours as needed for pain.     . TRUEplus Lancets 28G MISC Use as instructed to test blood sugar daily. (Patient not taking: Reported on 09/27/2019) 100 each 6  . VENTOLIN HFA 108 (90 Base) MCG/ACT inhaler INHALE 2 PUFFS INTO THE LUNGS EVERY 6 (SIX) HOURS AS NEEDED FOR WHEEZING OR SHORTNESS OF BREATH. 18 g 2   No current facility-administered medications for this visit.    Allergies as of 11/20/2019 - Review Complete 11/14/2019  Allergen Reaction Noted  . Penicillins Swelling 12/27/2011    Family History  Problem Relation Age of Onset  . Hypertension Mother   . Cancer Father     Social History   Socioeconomic History  . Marital status: Married    Spouse name: Not on file  . Number of children: Not on file  . Years of education: Not on file  . Highest  education level: Not on file  Occupational History  . Not on file  Tobacco Use  . Smoking status: Former Research scientist (life sciences)  . Smokeless tobacco: Never Used  Substance and Sexual Activity  . Alcohol use: No    Alcohol/week: 0.0 standard drinks  . Drug use: No  . Sexual activity: Yes  Other Topics Concern  . Not on file  Social History Narrative  . Not on file   Social Determinants of Health   Financial Resource Strain:   . Difficulty of Paying Living Expenses:   Food Insecurity:   . Worried About Charity fundraiser in the Last Year:   . Arboriculturist in the Last Year:   Transportation Needs:   . Film/video editor (Medical):   Marland Kitchen Lack of Transportation (Non-Medical):   Physical Activity:   . Days of Exercise per Week:   . Minutes of Exercise per Session:   Stress:   . Feeling of Stress :   Social Connections:   . Frequency of Communication with Friends and Family:   .  Frequency of Social Gatherings with Friends and Family:   . Attends Religious Services:   . Active Member of Clubs or Organizations:   . Attends Archivist Meetings:   Marland Kitchen Marital Status:   Intimate Partner Violence:   . Fear of Current or Ex-Partner:   . Emotionally Abused:   Marland Kitchen Physically Abused:   . Sexually Abused:     Review of Systems: 12 system ROS is negative except as noted above.   Physical Exam: General:   Alert,  well-nourished, pleasant and cooperative in NAD Head:  Normocephalic and atraumatic. Eyes:  Sclera clear, no icterus.   Conjunctiva pink. Ears:  Normal auditory acuity. Nose:  No deformity, discharge,  or lesions. Mouth:  No deformity or lesions.   Neck:  Supple; no masses or thyromegaly. Lungs:  Clear throughout to auscultation.   No wheezes. Heart:  Regular rate and rhythm; no murmurs. Abdomen:  Soft,nontender, nondistended, normal bowel sounds, no rebound or guarding. No hepatosplenomegaly.   Rectal:  Deferred  Msk:  Symmetrical. No boney deformities LAD: No inguinal  or umbilical LAD Extremities:  No clubbing or edema. Neurologic:  Alert and  oriented x4;  grossly nonfocal Skin:  Intact without significant lesions or rashes. Psych:  Alert and cooperative. Normal mood and affect.     Praise Dolecki L. Tarri Glenn, MD, MPH 11/20/2019, 3:40 PM

## 2019-12-13 ENCOUNTER — Telehealth: Payer: Self-pay

## 2019-12-13 ENCOUNTER — Telehealth: Payer: Self-pay | Admitting: *Deleted

## 2019-12-13 NOTE — Telephone Encounter (Signed)
Forms need to be signed by Dr. Chapman Fitch. Placed in her mailbox.

## 2019-12-13 NOTE — Telephone Encounter (Signed)
Pt brought [employer] app stating surgeon has someone helping with the asst program & that person has completed their part & instructed pt to bring to PCP office for completion. Call pt [phone] for clarification. Placed forms in C[facility] ox.

## 2019-12-13 NOTE — Telephone Encounter (Signed)
Patient assistance program forms were faxed to the number on the top of the sheet and told patient to come to office to pick up the original. Pt agreed and verbalized understanding

## 2019-12-19 ENCOUNTER — Other Ambulatory Visit: Payer: Self-pay

## 2019-12-19 ENCOUNTER — Ambulatory Visit: Payer: Self-pay

## 2019-12-21 MED FILL — !LANTUS 100 UNITS/ML VIAL: 100 | 28 days supply | Qty: 10 | Fill #1

## 2019-12-21 MED FILL — $TRULICITY 0.75 MG/0.5 ML P: 0.75 | 28 days supply | Qty: 2 | Fill #1

## 2019-12-31 ENCOUNTER — Telehealth: Payer: Self-pay | Admitting: Gastroenterology

## 2019-12-31 NOTE — Telephone Encounter (Signed)
Spoke with patient and told him to go to [facility] to pick up new instructions for the procedure. Patient understands instructions and voiced understanding.

## 2019-12-31 NOTE — Telephone Encounter (Signed)
LVM for patient to call back regarding instructions for procedure instructions

## 2019-12-31 NOTE — Telephone Encounter (Signed)
Pt's wife reported that they cannot afford Suprep because it is over $100.  Pt is scheduled for a colonoscopy 01/04/20.

## 2020-01-02 ENCOUNTER — Ambulatory Visit (INDEPENDENT_AMBULATORY_CARE_PROVIDER_SITE_OTHER): Payer: Self-pay

## 2020-01-02 ENCOUNTER — Other Ambulatory Visit: Payer: Self-pay | Admitting: Gastroenterology

## 2020-01-02 DIAGNOSIS — Z1159 Encounter for screening for other viral diseases: Secondary | ICD-10-CM

## 2020-01-03 LAB — SARS CORONAVIRUS 2 (TAT 6-24 HRS): SARS Coronavirus 2: NEGATIVE

## 2020-01-04 ENCOUNTER — Other Ambulatory Visit: Payer: Self-pay

## 2020-01-04 ENCOUNTER — Encounter: Payer: Self-pay | Admitting: Family Medicine

## 2020-01-04 ENCOUNTER — Telehealth: Payer: Self-pay

## 2020-01-04 ENCOUNTER — Encounter: Payer: Self-pay | Admitting: Gastroenterology

## 2020-01-04 ENCOUNTER — Ambulatory Visit (AMBULATORY_SURGERY_CENTER): Payer: Self-pay | Admitting: Gastroenterology

## 2020-01-04 VITALS — BP 158/88 | HR 68 | Temp 97.5°F | Resp 13 | Ht 71.0 in | Wt 276.0 lb

## 2020-01-04 DIAGNOSIS — K2970 Gastritis, unspecified, without bleeding: Secondary | ICD-10-CM

## 2020-01-04 DIAGNOSIS — K2980 Duodenitis without bleeding: Secondary | ICD-10-CM

## 2020-01-04 DIAGNOSIS — K5140 Inflammatory polyps of colon without complications: Secondary | ICD-10-CM

## 2020-01-04 DIAGNOSIS — K219 Gastro-esophageal reflux disease without esophagitis: Secondary | ICD-10-CM

## 2020-01-04 DIAGNOSIS — R140 Abdominal distension (gaseous): Secondary | ICD-10-CM

## 2020-01-04 DIAGNOSIS — K635 Polyp of colon: Secondary | ICD-10-CM

## 2020-01-04 DIAGNOSIS — D124 Benign neoplasm of descending colon: Secondary | ICD-10-CM

## 2020-01-04 DIAGNOSIS — K649 Unspecified hemorrhoids: Secondary | ICD-10-CM

## 2020-01-04 DIAGNOSIS — R1084 Generalized abdominal pain: Secondary | ICD-10-CM

## 2020-01-04 DIAGNOSIS — D120 Benign neoplasm of cecum: Secondary | ICD-10-CM

## 2020-01-04 DIAGNOSIS — K5730 Diverticulosis of large intestine without perforation or abscess without bleeding: Secondary | ICD-10-CM

## 2020-01-04 HISTORY — DX: Polyp of colon: K63.5

## 2020-01-04 MED ORDER — SODIUM CHLORIDE 0.9 % IV SOLN: 500.0000 mL | Freq: Once | INTRAVENOUS | Status: DC

## 2020-01-04 NOTE — Progress Notes (Signed)
Temperature and VS taken by D.T.

## 2020-01-04 NOTE — Op Note (Signed)
[facility] Patient Name: Mark Ferrell Procedure Date: 01/04/2020 10:54 AM MRN: [id number] Endoscopist: Thornton Park MD, MD Age: 49 Referring MD:  Date of Birth: August 10, 1970 Gender: Male Account #: [id number] Procedure:                Colonoscopy Indications:              Upper abdominal pain, Abnormal CT of the GI tract Medicines:                Monitored Anesthesia Care Procedure:                Pre-Anesthesia Assessment:                           - Prior to the procedure, a History and Physical                            was performed, and patient medications and                            allergies were reviewed. The patient's tolerance of                            previous anesthesia was also reviewed. The risks                            and benefits of the procedure and the sedation                            options and risks were discussed with the patient.                            All questions were answered, and informed consent                            was obtained. Prior Anticoagulants: The patient has                            taken no previous anticoagulant or antiplatelet                            agents. ASA Grade Assessment: III - A patient with                            severe systemic disease. After reviewing the risks                            and benefits, the patient was deemed in                            satisfactory condition to undergo the procedure.                           After obtaining informed consent, the colonoscope  was passed under direct vision. Throughout the                            procedure, the patient's blood pressure, pulse, and                            oxygen saturations were monitored continuously. The                            Colonoscope was introduced through the anus and                            advanced to the 4 cm into the ileum. The                            colonoscopy was  performed without difficulty. The                            patient tolerated the procedure well. The quality                            of the bowel preparation was good. The terminal                            ileum, ileocecal valve, appendiceal orifice, and                            rectum were photographed. Scope In: 11:17:20 AM Scope Out: 56:38:75 AM Scope Withdrawal Time: 0 hours 16 minutes 33 seconds  Total Procedure Duration: 0 hours 18 minutes 59 seconds  Findings:                 The perianal and digital rectal examinations were                            normal.                           Multiple small and large-mouthed diverticula were                            found in the sigmoid colon and descending colon.                           A 3 mm polyp was found in the descending colon. The                            polyp was flat. The polyp was removed with a cold                            snare. Resection and retrieval were complete.                            Estimated blood loss was minimal.  A 2 mm polyp was found in the cecum. The polyp was                            sessile. The polyp was removed with a cold snare.                            Resection and retrieval were complete. Estimated                            blood loss was minimal.                           Non-bleeding internal hemorrhoids were found. The                            hemorrhoids were small.                           The exam was otherwise without abnormality on                            direct and retroflexion views. Complications:            No immediate complications. Estimated blood loss:                            Minimal. Estimated Blood Loss:     Estimated blood loss was minimal. Impression:               - Diverticulosis in the sigmoid colon and in the                            descending colon.                           - One 3 mm polyp in the descending  colon, removed                            with a cold snare. Resected and retrieved.                           - One 2 mm polyp in the cecum, removed with a cold                            snare. Resected and retrieved.                           - Non-bleeding internal hemorrhoids.                           - The examination was otherwise normal on direct                            and retroflexion views. Recommendation:           - Patient has a contact number  available for                            emergencies. The signs and symptoms of potential                            delayed complications were discussed with the                            patient. Return to normal activities tomorrow.                            Written discharge instructions were provided to the                            patient.                           - Follow a high fiber diet. Drink at least 64                            ounces of water daily. Add a daily stool bulking                            agent such as psyllium (an exampled would be                            Metamucil).                           - Continue present medications.                           - Await pathology results.                           - Repeat colonoscopy date to be determined after                            pending pathology results are reviewed for                            surveillance.                           - Emerging evidence supports eating a diet of                            fruits, vegetables, grains, calcium, and yogurt                            while reducing red meat and alcohol may reduce the                            risk of colon cancer.                           -  Thank you for allowing me to be involved in your                            colon cancer prevention. Thornton Park MD, MD 01/04/2020 11:53:43 AM This report has been signed electronically.

## 2020-01-04 NOTE — Progress Notes (Signed)
To PACU, VSS. Report to [email or web address] 

## 2020-01-04 NOTE — Patient Instructions (Signed)
Read all handouts given to you by your recovery  Nurse.  YOU HAD AN ENDOSCOPIC PROCEDURE TODAY AT [facility]:   Refer to the procedure report that was given to you for any specific questions about what was found during the examination.  If the procedure report does not answer your questions, please call your gastroenterologist to clarify.  If you requested that your care partner not be given the details of your procedure findings, then the procedure report has been included in a sealed envelope for you to review at your convenience later.  YOU SHOULD EXPECT: Some feelings of bloating in the abdomen. Passage of more gas than usual.  Walking can help get rid of the air that was put into your GI tract during the procedure and reduce the bloating. If you had a lower endoscopy (such as a colonoscopy or flexible sigmoidoscopy) you may notice spotting of blood in your stool or on the toilet paper. If you underwent a bowel prep for your procedure, you may not have a normal bowel movement for a few days.  Please Note:  You might notice some irritation and congestion in your nose or some drainage.  This is from the oxygen used during your procedure.  There is no need for concern and it should clear up in a day or so.  Try to increase the fiber in your diet, and drink plenty of water  SYMPTOMS TO REPORT IMMEDIATELY:   Following lower endoscopy (colonoscopy or flexible sigmoidoscopy):  Excessive amounts of blood in the stool  Significant tenderness or worsening of abdominal pains  Swelling of the abdomen that is new, acute  Fever of 100F or higher   Following upper endoscopy (EGD)  Vomiting of blood or coffee ground material  New chest pain or pain under the shoulder blades  Painful or persistently difficult swallowing  New shortness of breath  Fever of 100F or higher  Black, tarry-looking stools  For urgent or emergent issues, a gastroenterologist can be reached at any hour by  calling [phone]. Do not use MyChart messaging for urgent concerns.    DIET:  We do recommend a small meal at first, but then you may proceed to your regular diet.  Drink plenty of fluids but you should avoid alcoholic beverages for 24 hours.  Try to increase the fiber in your diet, and drink plenty of water.  Limit the red meat and alcohol.  ACTIVITY:  You should plan to take it easy for the rest of today and you should NOT DRIVE or use heavy machinery until tomorrow (because of the sedation medicines used during the test).    FOLLOW UP: Our staff will call the number listed on your records 48-72 hours following your procedure to check on you and address any questions or concerns that you may have regarding the information given to you following your procedure. If we do not reach you, we will leave a message.  We will attempt to reach you two times.  During this call, we will ask if you have developed any symptoms of COVID 19. If you develop any symptoms (ie: fever, flu-like symptoms, shortness of breath, cough etc.) before then, please call [phone].  If you test positive for Covid 19 in the 2 weeks post procedure, please call and report this information to [place].    If any biopsies were taken you will be contacted by phone or by letter within the next 1-3 weeks.  Please call us at [phone] if you have not heard about the biopsies in 3 weeks.    SIGNATURES/CONFIDENTIALITY: You and/or your care partner have signed paperwork which will be entered into your electronic medical record.  These signatures attest to the fact that that the information above on your After Visit Summary has been reviewed and is understood.  Full responsibility of the confidentiality of this discharge information lies with you and/or your care-partner.

## 2020-01-04 NOTE — Telephone Encounter (Signed)
Went to help [facility] in recovery and discovered that patient left his discharge paperwork, tried to catch patient at entrance, but he had already left with his ride, called patient immediately, explained that he had left his discharge paperwork and that it is important that he have this information so he knows what symptoms to look out for post-procedure and who to call, especially being a Friday. Pt. Verbalizes understanding and states his wife will come back in a couple of hours and pick it up. Paperwork placed in envelope and left with his recovery nurse. [place] desk notified.

## 2020-01-04 NOTE — Op Note (Signed)
[place] Patient Name: Mark Ferrell Procedure Date: 01/04/2020 10:55 AM MRN: [id number] Endoscopist: Thornton Park MD, MD Age: 49 Referring MD:  Date of Birth: 1970/11/22 Gender: Male Account #: [id number] Procedure:                Upper GI endoscopy Indications:              Upper abdominal pain, Abdominal bloating                           Abdominal pain                           Gas/Bloating with eructation                           Intermittent dysphagia to solids Medicines:                Monitored Anesthesia Care Procedure:                Pre-Anesthesia Assessment:                           - Prior to the procedure, a History and Physical                            was performed, and patient medications and                            allergies were reviewed. The patient's tolerance of                            previous anesthesia was also reviewed. The risks                            and benefits of the procedure and the sedation                            options and risks were discussed with the patient.                            All questions were answered, and informed consent                            was obtained. Prior Anticoagulants: The patient has                            taken no previous anticoagulant or antiplatelet                            agents. ASA Grade Assessment: III - A patient with                            severe systemic disease. After reviewing the risks                            and  benefits, the patient was deemed in                            satisfactory condition to undergo the procedure.                           After obtaining informed consent, the endoscope was                            passed under direct vision. Throughout the                            procedure, the patient's blood pressure, pulse, and                            oxygen saturations were monitored continuously. The                             Endoscope was introduced through the mouth, and                            advanced to the third part of duodenum. The upper                            GI endoscopy was accomplished without difficulty.                            The patient tolerated the procedure well. Scope In: Scope Out: Findings:                 The examined esophagus was normal. No ring, web, or                            stricture. Biopsies were obtained from the proximal                            and distal esophagus with cold forceps for                            histology of suspected eosinophilic esophagitis.                           Multiple linear erosions with no bleeding and no                            stigmata of recent bleeding were found in the                            gastric antrum. Biopsies were taken from the                            antrum, body, and fundus with a cold forceps for  histology. Biopsies were taken with a cold forceps                            for histology.                           The examined duodenum was normal. Biopsies were                            taken with a cold forceps for histology. Estimated                            blood loss was minimal.                           The cardia and gastric fundus were normal on                            retroflexion.                           The exam was otherwise without abnormality. Complications:            No immediate complications. Estimated blood loss:                            Minimal. Estimated Blood Loss:     Estimated blood loss was minimal. Impression:               - Normal esophagus. Biopsied.                           - Erosive gastropathy with no bleeding and no                            stigmata of recent bleeding. Biopsied.                           - Normal examined duodenum. Biopsied.                           - The examination was otherwise normal. Recommendation:           -  Patient has a contact number available for                            emergencies. The signs and symptoms of potential                            delayed complications were discussed with the                            patient. Return to normal activities tomorrow.                            Written discharge instructions were provided to the  patient.                           - Resume previous diet.                           - Continue present medications.                           - No aspirin, ibuprofen, naproxen, or other                            non-steroidal anti-inflammatory drugs.                           - Await pathology results.                           - Proceed with colonoscopy as previously noted. Thornton Park MD, MD 01/04/2020 11:45:12 AM This report has been signed electronically.

## 2020-01-08 ENCOUNTER — Telehealth: Payer: Self-pay

## 2020-01-08 NOTE — Telephone Encounter (Signed)
  Follow up Call-  Call back number 01/04/2020  Post procedure Call Back phone  # [phone]  Permission to leave phone message Yes  Some recent data might be hidden     Patient questions:  Do you have a fever, pain , or abdominal swelling? No. Pain Score  0 *  Have you tolerated food without any problems? Yes.    Have you been able to return to your normal activities? Yes.    Do you have any questions about your discharge instructions: Diet   No. Medications  No. Follow up visit  No.  Do you have questions or concerns about your Care? No.  Actions: * If pain score is 4 or above: No action needed, pain <4. 1. Have you developed a fever since your procedure? no  2.   Have you had an respiratory symptoms (SOB or cough) since your procedure? no  3.   Have you tested positive for COVID 19 since your procedure no  4.   Have you had any family members/close contacts diagnosed with the COVID 19 since your procedure?  no   If yes to any of these questions please route to Joylene John, RN and Erenest Rasher, RN

## 2020-01-18 ENCOUNTER — Other Ambulatory Visit: Payer: Self-pay | Admitting: Family Medicine

## 2020-01-18 DIAGNOSIS — Z8719 Personal history of other diseases of the digestive system: Secondary | ICD-10-CM

## 2020-01-18 DIAGNOSIS — R1032 Left lower quadrant pain: Secondary | ICD-10-CM

## 2020-01-18 MED FILL — ?CITALOPRAM HBR 40 MG TABLE: 40 | 30 days supply | Qty: 30 | Fill #1

## 2020-01-18 MED FILL — !LANTUS 100 UNITS/ML VIAL: 100 | 28 days supply | Qty: 10 | Fill #2

## 2020-01-18 MED FILL — $TRULICITY 0.75 MG/0.5 ML P: 0.75 | 56 days supply | Qty: 4 | Fill #1

## 2020-01-18 MED FILL — metFORMIN HCL 1000 MG TABS: 1000 | 30 days supply | Qty: 60 | Fill #1

## 2020-01-18 MED FILL — $TRULICITY 0.75 MG/0.5 ML P: 0.75 | 28 days supply | Qty: 2 | Fill #2

## 2020-01-18 MED FILL — busPIRone HCL 15 MG TABS: 15 | 30 days supply | Qty: 90 | Fill #1

## 2020-01-29 ENCOUNTER — Telehealth: Payer: Self-pay | Admitting: Licensed Clinical Social Worker

## 2020-01-29 NOTE — Telephone Encounter (Signed)
Follow up call placed to patient regarding his request on how to obtain medical records. LCSW left message requesting a return call.

## 2020-02-01 MED FILL — ALBUTEROL SULFATE HFA 108 (: 108 (90 BAS | 25 days supply | Qty: 18 | Fill #1

## 2020-02-04 ENCOUNTER — Encounter: Payer: Self-pay | Admitting: Gastroenterology

## 2020-02-04 ENCOUNTER — Telehealth: Payer: Self-pay | Admitting: Gastroenterology

## 2020-02-04 NOTE — Telephone Encounter (Signed)
Dr. Tarri Glenn,  Have you had a chance to review pathology results from Endo/colon on 01/04/20. Please advise, thank you

## 2020-02-04 NOTE — Telephone Encounter (Signed)
Oh my! My apologizes for the delay. I wrote a path letter to the patient summarizing the findings. Please offer him a follow-up appointment if his symptoms continue despite omeprazole 40 mg BID. Thank you.

## 2020-02-04 NOTE — Telephone Encounter (Signed)
Patient called for path results 

## 2020-02-05 NOTE — Telephone Encounter (Signed)
Lm on vm for patient to return call 

## 2020-02-06 ENCOUNTER — Other Ambulatory Visit: Payer: Self-pay

## 2020-02-06 ENCOUNTER — Ambulatory Visit: Payer: Self-pay | Attending: Family Medicine | Admitting: Licensed Clinical Social Worker

## 2020-02-06 ENCOUNTER — Telehealth: Payer: Self-pay

## 2020-02-06 DIAGNOSIS — F331 Major depressive disorder, recurrent, moderate: Secondary | ICD-10-CM

## 2020-02-06 DIAGNOSIS — F411 Generalized anxiety disorder: Secondary | ICD-10-CM

## 2020-02-06 MED FILL — GABAPENTIN 300 MG CAPSULE: 300 | 30 days supply | Qty: 90 | Fill #2

## 2020-02-06 NOTE — Telephone Encounter (Signed)
Pt requesting pain medication for back pain. Pt states PCP has prescribed for him in the past & also is aware of his back pain.

## 2020-02-07 NOTE — Telephone Encounter (Signed)
Lm on vm for patient to return call 

## 2020-02-07 NOTE — Telephone Encounter (Signed)
Please schedule an office visit with any available Clinician

## 2020-02-08 NOTE — Telephone Encounter (Signed)
Scheduled appt next week 02/14/20. Called pt LVM.

## 2020-02-11 NOTE — Telephone Encounter (Signed)
Mark Ferrell had not returned calls. Left detailed message for Mark Ferrell on his voicemail regarding Dr. Tarri Glenn comments below and that he will be receiving a letter in the mail.

## 2020-02-12 ENCOUNTER — Ambulatory Visit: Payer: Medicaid Other | Admitting: Internal Medicine

## 2020-02-12 NOTE — BH Specialist Note (Signed)
Integrated Behavioral Health Initial Visit  MRN: [id number] Name: Mark Ferrell  Number of [place] Clinician visits:: 1/6 Session Start time: 1:45 PM  Session End time: 2:05 PM Total time: 20  Type of Service: [place] Interpretor:No. Interpretor Name and Language: NA   SUBJECTIVE: Mark Ferrell is a 49 y.o. male accompanied by self Patient was referred by Weyman Pedro for anxiety and depression. Patient reports the following symptoms/concerns: Pt reports ongoing psychosocial stressors that negatively impact management of mental health symptoms. Pt is caring for wife while experiencing ongoing back pain. Shared brother is battling cancer Duration of problem: Ongoing; Severity of problem: moderate  OBJECTIVE: Mood: Anxious and Affect: Appropriate Risk of harm to self or others: No plan to harm self or others  LIFE CONTEXT: Family and Social: Pt resides with spouse. He receives strong family support School/Work: Pt has been approved for the [place] and [employer]. Has pending disability claim and support from lawyer Self-Care: Pt reports interest in referral to pain management clinic to assist with decrease of symptoms. States he has been denied medicaid Life Changes: Pt has difficulty managing mental and physical health conditions  GOALS ADDRESSED: Patient will: 1. Reduce symptoms of: agitation, anxiety, depression and stress 2. Increase knowledge and/or ability of: stress reduction  3. Demonstrate ability to: Increase healthy adjustment to current life circumstances, Increase adequate support systems for patient/family and Increase motivation to adhere to plan of care  INTERVENTIONS: Interventions utilized: Solution-Focused Strategies and Supportive Counseling  Standardized Assessments completed: Not Needed  ASSESSMENT: Patient currently experiencing difficulty managing depression and anxiety symptoms triggered by  psychosocial stressors and chronic pain.   Patient may benefit from continued medication management. LCSW discussed self care strategies to assist with management of stressors. Pt provided verbal consent for LCSW to complete a Legal Aid referral to assist with appeal for medicaid.   PLAN: 1. Follow up with behavioral health clinician on : Contact LCSW with any additional behavioral health and/or resource needs 2. Behavioral recommendations: Utilize strategies discussed, continue compliance with medications, and follow up with Legal Aid 3. Referral(s): [place] (In Clinic) and [employer] Resources:  Finances 4. "From scale of 1-10, how likely are you to follow plan?":   Rebekah Chesterfield, LCSW  02/15/2020 10:40 AM

## 2020-02-13 ENCOUNTER — Telehealth: Payer: Self-pay | Admitting: Licensed Clinical Social Worker

## 2020-02-13 NOTE — Telephone Encounter (Signed)
Incoming call received from patient. Patient shared that spouse is at a medical appointment and was informed that [place] and [employer] card has been expired. LCSW agreed to mail Financial Counseling application to residence on file.   Pt reports the need for medicaid to assist with pain management clinic referral. LCSW received verbal consent from patient to complete Legal Aid referral to assist with medicaid application/appeal.   No additional concerns noted.

## 2020-03-03 MED FILL — GABAPENTIN 300 MG CAPSULE: 300 | 30 days supply | Qty: 90 | Fill #3

## 2020-03-04 ENCOUNTER — Telehealth: Payer: Self-pay | Admitting: Licensed Clinical Social Worker

## 2020-03-04 NOTE — Telephone Encounter (Signed)
Completed Legal Aid referral to assist with Medicaid appeal

## 2020-03-26 MED FILL — !VENTOLIN HFA INHALER: 108 (90 BAS | 25 days supply | Qty: 18 | Fill #2

## 2020-04-08 ENCOUNTER — Other Ambulatory Visit: Payer: Self-pay | Admitting: Critical Care Medicine

## 2020-04-10 ENCOUNTER — Other Ambulatory Visit: Payer: Self-pay | Admitting: Critical Care Medicine

## 2020-04-15 ENCOUNTER — Ambulatory Visit: Payer: Medicaid Other | Attending: Family | Admitting: Family

## 2020-04-15 ENCOUNTER — Other Ambulatory Visit: Payer: Self-pay

## 2020-04-15 ENCOUNTER — Other Ambulatory Visit: Payer: Self-pay | Admitting: Pharmacist

## 2020-04-15 ENCOUNTER — Encounter: Payer: Self-pay | Admitting: Family

## 2020-04-15 ENCOUNTER — Other Ambulatory Visit: Payer: Self-pay | Admitting: Critical Care Medicine

## 2020-04-15 VITALS — BP 134/89 | HR 78 | Temp 98.1°F | Resp 20 | Ht 72.0 in | Wt 290.6 lb

## 2020-04-15 DIAGNOSIS — E119 Type 2 diabetes mellitus without complications: Secondary | ICD-10-CM

## 2020-04-15 DIAGNOSIS — G894 Chronic pain syndrome: Secondary | ICD-10-CM

## 2020-04-15 DIAGNOSIS — M545 Low back pain, unspecified: Secondary | ICD-10-CM

## 2020-04-15 DIAGNOSIS — Z794 Long term (current) use of insulin: Secondary | ICD-10-CM

## 2020-04-15 DIAGNOSIS — F1124 Opioid dependence with opioid-induced mood disorder: Secondary | ICD-10-CM

## 2020-04-15 DIAGNOSIS — E1169 Type 2 diabetes mellitus with other specified complication: Secondary | ICD-10-CM

## 2020-04-15 DIAGNOSIS — E785 Hyperlipidemia, unspecified: Secondary | ICD-10-CM

## 2020-04-15 LAB — GLUCOSE, POCT (MANUAL RESULT ENTRY): POC Glucose: 146 mg/dl — AB (ref 70–99)

## 2020-04-15 MED ORDER — TRUE METRIX BLOOD GLUCOSE TEST VI STRP: ORAL_STRIP | 5 refills | Status: DC

## 2020-04-15 MED ORDER — TRUE METRIX METER W/DEVICE KIT: PACK | 0 refills | Status: DC

## 2020-04-15 MED ORDER — ATORVASTATIN CALCIUM 20 MG PO TABS: 20.0000 mg | ORAL_TABLET | Freq: Every day | ORAL | 0 refills | Status: DC

## 2020-04-15 MED ORDER — TRULICITY 0.75 MG/0.5ML ~~LOC~~ SOAJ: 0.7500 mg | SUBCUTANEOUS | 0 refills | Status: DC

## 2020-04-15 MED ORDER — TRUEPLUS LANCETS 28G MISC: 6 refills | Status: DC

## 2020-04-15 MED ORDER — TRUE METRIX BLOOD GLUCOSE TEST VI STRP: ORAL_STRIP | 12 refills | Status: DC

## 2020-04-15 MED ORDER — INSULIN GLARGINE 100 UNIT/ML ~~LOC~~ SOLN: 35.0000 [IU] | Freq: Every day | SUBCUTANEOUS | 0 refills | Status: DC

## 2020-04-15 MED ORDER — METFORMIN HCL 1000 MG PO TABS: 1000.0000 mg | ORAL_TABLET | Freq: Two times a day (BID) | ORAL | 0 refills | Status: DC

## 2020-04-15 MED FILL — TRUE METRIX TEST STRIP: 25 days supply | Qty: 100 | Fill #0

## 2020-04-15 MED FILL — ATORVASTATIN CALCIUM 20 MG: 20 | 30 days supply | Qty: 30 | Fill #0

## 2020-04-15 MED FILL — metFORMIN HCL 1000 MG TABS: 1000 | 30 days supply | Qty: 60 | Fill #0

## 2020-04-15 MED FILL — LANTUS 100 UNITS/ML VIAL: 100 | 28 days supply | Qty: 10 | Fill #0

## 2020-04-15 MED FILL — TRUEplus LANCETS 28G MISC: 25 days supply | Qty: 100 | Fill #0

## 2020-04-15 MED FILL — ALBUTEROL SULFATE HFA 108 (: 108 (90 BAS | 25 days supply | Qty: 18 | Fill #2

## 2020-04-15 MED FILL — $TRULICITY 0.75 MG/0.5 ML P: 0.75 | 28 days supply | Qty: 2 | Fill #0

## 2020-04-15 MED FILL — !TRUE METRIX BLOOD GLUCOSE: 1 days supply | Qty: 1 | Fill #0

## 2020-04-15 NOTE — Progress Notes (Signed)
Patient ID: EL PILE, male    DOB: January 03, 1971  MRN: [id number]  CC: Back Pain  Subjective: Mark Ferrell is a 49 y.o. male with history of colon polyps, controlled type 2 diabetes mellitus, hyperlipidemia associated with type 2 diabetes mellitus, anxiety and depression, chronic pain, opioid dependence, and low back pain who presents for shoulder and knee pain.   1. BACK AND KNEE PAIN FOLLOW-UP: 09/06/2019: Visit with Dr. Chapman Fitch. During that encounter referred to Orthopedics in follow-up of chronic right knee pain. Referred to Pain Clinic.  08/31/2019: Visit with Dr. Lorin Mercy at Orthopedics. During that encounter reviewed MRI scan with patient as well as plain radiographs. He had no compressive lesions on his scan. His pelvic fracture was surgically stabilized with screws and completely healed without residual deformity. No soft tissue calcification. Discussed poor results with pain and chronic opioids. Recommendation to work on an exercise program riding an exercise bike to help with overall fitness which would also help with pain.  09/27/2019: Visit with Dr. Chapman Fitch. During that encounter patient with chronic issues with back pain with radiation. Did not feel that the use of a TENS unit worked in the past and he agreed to be referred back to PT for TENs therapy and also discussed that he may benefit from steroid therapy to his area of pain in the low back. Referred to pain management and physical therapy.   04/15/2020: Location: right lower back pain  Onset: chronic  Description: sharp, throbbing Radiation: tailbone, and right knee  Symptoms Worse with: walking and sitting Better with: pain medications specifically stating that he needs narcotics to have any symptom relief, reports he will continue to purchase narcotics from friends until a provider prescribes them for him Trauma: reports work-related injury in [address]7 Popping/clicking sounds with movement/bending: knees  Muscle spasms:  yes  Red Flags Fecal/urinary incontinence: denies Weakness: yes Fever/chills: denies Night pain:  yes  Reports pain management and physical therapy will not accept [place] financial discount/orange card.  2. DIABETES FOLLOW-UP: Requests refills on medications. Patient Active Problem List   Diagnosis Date Noted  . Colon polyps 01/04/2020  . Opioid dependence ([place]) 07/02/2019  . Low back pain 07/02/2019  . Hyperlipidemia associated with type 2 diabetes mellitus ([place]) 04/17/2019  . Controlled type 2 diabetes mellitus ([place]) 04/16/2019  . Anxiety and depression 04/16/2019  . Chronic pain 04/16/2019     Current Outpatient Medications on File Prior to Visit  Medication Sig Dispense Refill  . albuterol (VENTOLIN HFA) 108 (90 Base) MCG/ACT inhaler Inhale 2 puffs into the lungs every 6 (six) hours as needed for wheezing or shortness of breath. 18 g 5  . atorvastatin (LIPITOR) 20 MG tablet Take 1 tablet (20 mg total) by mouth daily. 90 tablet 3  . blood glucose meter kit and supplies KIT Dispense based on patient and insurance preference. Use up to four times daily as directed. (FOR ICD-9 250.00, 250.01). 1 each 0  . busPIRone (BUSPAR) 15 MG tablet Take 1 tablet (15 mg total) by mouth 3 (three) times daily. 90 tablet 3  . citalopram (CELEXA) 40 MG tablet Take 1 tablet (40 mg total) by mouth daily. 30 tablet 3  . Dulaglutide (TRULICITY) 2.62 MB/5.5HR SOPN Inject 0.75 mg into the skin once a week. 2 mL 2  . glucose blood (TRUE METRIX BLOOD GLUCOSE TEST) test strip Use as instructed 100 each 12  . insulin glargine (LANTUS) 100 UNIT/ML injection Inject 0.35 mLs (35 Units total) into the  skin at bedtime. 10 mL 11  . Lactulose 20 GM/30ML SOLN Take 30 ml at bedtime and up to twice daily as needed 450 mL 4  . metFORMIN (GLUCOPHAGE) 1000 MG tablet Take 1 tablet (1,000 mg total) by mouth 2 (two) times daily with a meal. 60 tablet 2  . omeprazole (PRILOSEC) 40 MG capsule Take 1 capsule (40 mg total)  by mouth 2 (two) times daily. 60 capsule 3  . oxyCODONE-acetaminophen (PERCOCET) 10-325 MG per tablet Take 1 tablet by mouth every 4 (four) hours as needed for pain.     . TRUEplus Lancets 28G MISC Use as instructed to test blood sugar daily. 100 each 6   No current facility-administered medications on file prior to visit.    Allergies  Allergen Reactions  . Penicillins Swelling    Social History   Socioeconomic History  . Marital status: Married    Spouse name: Not on file  . Number of children: 1  . Years of education: Not on file  . Highest education level: Not on file  Occupational History  . Occupation: unemployed  Tobacco Use  . Smoking status: Former Research scientist (life sciences)  . Smokeless tobacco: Never Used  Vaping Use  . Vaping Use: Never used  Substance and Sexual Activity  . Alcohol use: Yes    Alcohol/week: 0.0 standard drinks    Comment: rarely  . Drug use: Yes    Types: Marijuana    Comment: last use 2-3 days ago  . Sexual activity: Yes  Other Topics Concern  . Not on file  Social History Narrative  . Not on file   Social Determinants of Health   Financial Resource Strain:   . Difficulty of Paying Living Expenses: Not on file  Food Insecurity:   . Worried About Charity fundraiser in the Last Year: Not on file  . Ran Out of Food in the Last Year: Not on file  Transportation Needs:   . Lack of Transportation (Medical): Not on file  . Lack of Transportation (Non-Medical): Not on file  Physical Activity:   . Days of Exercise per Week: Not on file  . Minutes of Exercise per Session: Not on file  Stress:   . Feeling of Stress : Not on file  Social Connections:   . Frequency of Communication with Friends and Family: Not on file  . Frequency of Social Gatherings with Friends and Family: Not on file  . Attends Religious Services: Not on file  . Active Member of Clubs or Organizations: Not on file  . Attends Archivist Meetings: Not on file  . Marital Status:  Not on file  Intimate Partner Violence:   . Fear of Current or Ex-Partner: Not on file  . Emotionally Abused: Not on file  . Physically Abused: Not on file  . Sexually Abused: Not on file    Family History  Problem Relation Age of Onset  . Hypertension Mother   . Diabetes Mother   . Lung cancer Father   . Cancer Brother        in his sinus's area and head  . Diabetes Paternal Grandfather   . Colon polyps Neg Hx   . Esophageal cancer Neg Hx   . Rectal cancer Neg Hx   . Stomach cancer Neg Hx     Past Surgical History:  Procedure Laterality Date  . BONY PELVIS SURGERY    . HUMERUS FRACTURE SURGERY Right 2009    ROS: Review of Systems  Negative except as stated above  PHYSICAL EXAM: Vitals with BMI 04/15/2020 01/04/2020 01/04/2020  Height '6\' 0"'  - -  Weight 290 lbs 10 oz - -  BMI 78.5 - -  Systolic 885 027 741  Diastolic 89 88 99  Pulse 78 - -   Wt Readings from Last 3 Encounters:  04/15/20 290 lb 9.6 oz (131.8 kg)  01/04/20 276 lb (125.2 kg)  11/20/19 276 lb (125.2 kg)   Physical Exam General appearance - alert, well appearing, and in no distress, overweight and anxious/agitated Mental status - alert, oriented to person, place, and time, anxious and agitated Neck - supple, no significant adenopathy Lymphatics - no palpable lymphadenopathy, no hepatosplenomegaly Chest - clear to auscultation, no wheezes, rales or rhonchi, symmetric air entry, no tachypnea, retractions or cyanosis Heart - normal rate, regular rhythm, normal S1, S2, no murmurs, rubs, clicks or gallops Back Exam:    Inspection:  Normal spinal curvature.  No deformity, ecchymosis, erythema, or lesions   Curvature: Normal   Deformity: no  Ecchymosis: no none  Erythema:  no none  Lesions: no    Palpation:     Midline spinal tenderness: yes lumbar  Paralumbar tenderness: yes Right     Parathoracic tenderness: no none     Buttocks tenderness: yesRight     Range of Motion: limited active and passive  range of motion     Flexion: Fingers to Knees     Extension:Decreased     Lateral bending:Decreased    Rotation:Decreased     Ankle dorsiflexion strength:Within Normal Limits    Special Tests:      Straight leg raise:positive Neurological - alert, oriented, normal speech, no focal findings or movement disorder noted Musculoskeletal -  abnormal active range of motion of bilateral lower extremities, abnormal passive range of motion of bilateral lower extremities, patient with possible right leg length discrepancy as well as abnormal stance as he has pronounced over-pronation of the right foot/ankle area which causes abnormal gait. He is not using an assistive device at today's visit.  ASSESSMENT AND PLAN: 1. Lumbar pain with radiation down right leg: - Chronic lumbar pain with radiation to right leg.  - Reports Ibuprofen, Naproxen, Tylenol, and Gabapentin do not work.  - Requesting narcotics for pain management. Refuses any other pharmacological therapy for pain management. - Patient reports Pain Clinic will not accept [place] financial discount/orange card.  - Counseled patient that I will be unable to prescribe narcotic pain medication at this time. - Referral to Pain Clinic for further evaluation and treatment. I was able to confirm with referral coordinator that Pain Clinic will accept [place] discount/orange card and that a $150 co-pay is required for services.  - Patient expressed financial concerns related to cost of medications and referrals. Referral to Social Work for counseling and community resources.  - Ambulatory referral to Pain Clinic - Ambulatory referral to Social Work  2. Opioid dependence with opioid-induced mood disorder [facility]): - Patient admits that he buys or is given pain medication such as Percocet, Oxycodone, and Suboxone to help with his chronic pain.  - Referral to Pain Clinic for further evaluation and management. - Referral to Social Work for  counseling and community resources.  - Ambulatory referral to Pain Clinic - Ambulatory referral to Social Work  3. Chronic pain syndrome: - Patient with long standing issues with chronic pain and now with dependence on narcotic medication.  - Referral to Pain Clinic for further evaluation and management. -  Referral to Social Work for counseling and community resources.  - Ambulatory referral to Pain Clinic - Ambulatory referral to Social Work  4. Controlled type 2 diabetes mellitus without complication, with long-term current use of insulin [facility]): - Last visit April 2021. At that time diabetes controlled. - Patient requesting refills on diabetic medications. Will refill for 1 month supply as patient needs an appointment with primary physician for further evaluation and management. Patient verbalized understanding. - Continue Insulin Glargine, Dulaglutide, and Metformin as prescribed. - To achieve an A1C goal of less than or equal to 7.0 percent, a fasting blood sugar of 80 to 130 mg/dL and a postprandial glucose (90 to 120 minutes after a meal) less than 180 mg/dL. In the event of sugars less than 60 mg/dl or greater than 400 mg/dl please notify the clinic ASAP. It is recommended that you undergo annual eye exams and annual foot exams. - Discussed the importance of healthy eating habits, low-carbohydrate diet, low-sugar diet, regular aerobic exercise (at least 150 minutes a week as tolerated) and medication compliance to achieve or maintain control of diabetes. - Follow-up with primary physician in 1 month or sooner if needed. - Glucose (CBG) - insulin glargine (LANTUS) 100 UNIT/ML injection; Inject 0.35 mLs (35 Units total) into the skin at bedtime.  Dispense: 10 mL; Refill: 0 - Dulaglutide (TRULICITY) 8.28 MK/3.4JZ SOPN; Inject 0.75 mg into the skin once a week.  Dispense: 2 mL; Refill: 0 - metFORMIN (GLUCOPHAGE) 1000 MG tablet; Take 1 tablet (1,000 mg total) by mouth 2 (two) times daily with  a meal.  Dispense: 60 tablet; Refill: 0  5. Hyperlipidemia associated with type 2 diabetes mellitus ([place]): - Practice low-fat heart healthy diet and at least 150 minutes of moderate intensity exercise weekly as tolerated.  - Patient requesting refills on cholesterol medication. Will refill for 1 month supply as patient needs an appointment with primary physician for further evaluation and management. Patient verbalized understanding. - Continue Atorvastatin as prescribed.  - Follow-up with primary physician in 1 month or sooner if needed. - atorvastatin (LIPITOR) 20 MG tablet; Take 1 tablet (20 mg total) by mouth daily.  Dispense: 30 tablet; Refill: 0   Patient was given the opportunity to ask questions.  Patient verbalized understanding of the plan and was able to repeat key elements of the plan. Patient was given clear instructions to go to Emergency Department or return to medical center if symptoms don't improve, worsen, or new problems develop.The patient verbalized understanding.   Camillia Herter, NP
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
## 2020-04-15 NOTE — Patient Instructions (Addendum)
Referral to pain management and physical therapy for back pain. Referral to Social Work and Case Management for financial and community resources. Follow-up with primary physician in 1 month or sooner if needed for management of chronic conditions.  Radicular Pain Radicular pain is a type of pain that spreads from your back or neck along a spinal nerve. Spinal nerves are nerves that leave the spinal cord and go to the muscles. Radicular pain is sometimes called radiculopathy, radiculitis, or a pinched nerve. When you have this type of pain, you may also have weakness, numbness, or tingling in the area of your body that is supplied by the nerve. The pain may feel sharp and burning. Depending on which spinal nerve is affected, the pain may occur in the:  Neck area (cervical radicular pain). You may also feel pain, numbness, weakness, or tingling in the arms.  Mid-spine area (thoracic radicular pain). You would feel this pain in the back and chest. This type is rare.  Lower back area (lumbar radicular pain). You would feel this pain as low back pain. You may feel pain, numbness, weakness, or tingling in the buttocks or legs. Sciatica is a type of lumbar radicular pain that shoots down the back of the leg. Radicular pain occurs when one of the spinal nerves becomes irritated or squeezed (compressed). It is often caused by something pushing on a spinal nerve, such as one of the bones of the spine (vertebrae) or one of the round cushions between vertebrae (intervertebral disks). This can result from:  An injury.  Wear and tear or aging of a disk.  The growth of a bone spur that pushes on the nerve. Radicular pain often goes away when you follow instructions from your health care provider for relieving pain at home. Follow these instructions at home: Managing pain      If directed, put ice on the affected area: ? Put ice in a plastic bag. ? Place a towel between your skin and the bag. ? Leave the  ice on for 20 minutes, 2-3 times a day.  If directed, apply heat to the affected area as often as told by your health care provider. Use the heat source that your health care provider recommends, such as a moist heat pack or a heating pad. ? Place a towel between your skin and the heat source. ? Leave the heat on for 20-30 minutes. ? Remove the heat if your skin turns bright red. This is especially important if you are unable to feel pain, heat, or cold. You may have a greater risk of getting burned. Activity   Do not sit or rest in bed for long periods of time.  Try to stay as active as possible. Ask your health care provider what type of exercise or activity is best for you.  Avoid activities that make your pain worse, such as bending and lifting.  Do not lift anything that is heavier than 10 lb (4.5 kg), or the limit that you are told, until your health care provider says that it is safe.  Practice using proper technique when lifting items. Proper lifting technique involves bending your knees and rising up.  Do strength and range-of-motion exercises only as told by your health care provider or physical therapist. General instructions  Take over-the-counter and prescription medicines only as told by your health care provider.  Pay attention to any changes in your symptoms.  Keep all follow-up visits as told by your health care provider. This  is important. ? Your health care provider may send you to a physical therapist to help with this pain. Contact a health care provider if:  Your pain and other symptoms get worse.  Your pain medicine is not helping.  Your pain has not improved after a few weeks of home care.  You have a fever. Get help right away if:  You have severe pain, weakness, or numbness.  You have difficulty with bladder or bowel control. Summary  Radicular pain is a type of pain that spreads from your back or neck along a spinal nerve.  When you have  radicular pain, you may also have weakness, numbness, or tingling in the area of your body that is supplied by the nerve.  The pain may feel sharp or burning.  Radicular pain may be treated with ice, heat, medicines, or physical therapy. This information is not intended to replace advice given to you by your health care provider. Make sure you discuss any questions you have with your health care provider. Document Revised: 01/24/2018 Document Reviewed: 01/24/2018 Elsevier Patient Education  [place].

## 2020-04-15 NOTE — Telephone Encounter (Signed)
Requested medication (s) are due for refill today: No  Requested medication (s) are on the active medication list: no}  Last refill:  ?  Future visit scheduled: yes  Notes to clinic: not on active med list; d/c'd per Dr Asencion Noble    Requested Prescriptions  Pending Prescriptions Disp Refills   gabapentin (NEURONTIN) 300 MG capsule [Pharmacy Med Name: GABAPENTIN 300 MG CAPSULE 300 Capsule] 90 capsule 3    Sig: Take 1 capsule (300 mg total) by mouth 3 (three) times daily.      Neurology: Anticonvulsants - gabapentin Passed - 04/15/2020  8:41 AM      Passed - Valid encounter within last 12 months    Recent Outpatient Visits           5 months ago Anxiety and depression   [place], [place], [place]   6 months ago Epigastric pain   [place], [place]   7 months ago Controlled type 2 diabetes mellitus without complication, without long-term current use of insulin ([place])   [place], MD   9 months ago Uncontrolled type 2 diabetes mellitus with hyperglycemia [facility])   [place] Elsie Stain, MD   10 months ago Uncontrolled type 2 diabetes mellitus with hyperglycemia [facility])   [place], Jarome Matin, RPH-CPP       Future Appointments             Today Camillia Herter, NP [place]

## 2020-04-17 ENCOUNTER — Other Ambulatory Visit: Payer: Self-pay | Admitting: Family Medicine

## 2020-04-17 MED FILL — GABAPENTIN 300 MG CAPSULE: 300 | 30 days supply | Qty: 90 | Fill #0

## 2020-05-02 ENCOUNTER — Ambulatory Visit: Payer: Medicaid Other | Admitting: Family Medicine

## 2020-05-09 ENCOUNTER — Telehealth: Payer: Self-pay | Admitting: Licensed Clinical Social Worker

## 2020-05-09 NOTE — Telephone Encounter (Signed)
Call placed to patient regarding [facility] referral. Pt shared that due to financial strain, he obtained a part-time job. Pt is very motivated to find a pain clinic to assist with pain management in hopes of maintaining employment. He is currently working with his lawyer on disability and medicaid claim.   Pt reports that he has found a pain medicine clinic that will allow him to pay out of pocket; however, he needs to speak with them about their policy and procedures, to ensure that they are conducive to his schedule.  Pt is interested in obtaining his medical records from [facility]. LCSW reports that he will re-schedule follow up appointment with PCP. No additional concerns noted.

## 2020-05-26 ENCOUNTER — Other Ambulatory Visit: Payer: Self-pay | Admitting: Critical Care Medicine

## 2020-05-26 MED FILL — GABAPENTIN 300 MG CAPSULE: 300 | 30 days supply | Qty: 90 | Fill #0

## 2020-05-26 NOTE — Telephone Encounter (Signed)
Requested Prescriptions  Pending Prescriptions Disp Refills  . gabapentin (NEURONTIN) 300 MG capsule [Pharmacy Med Name: GABAPENTIN 300 MG CAPSULE 300 Capsule] 90 capsule 3    Sig: TAKE 1 CAPSULE (300 MG TOTAL) BY MOUTH 3 (THREE) TIMES DAILY.     Neurology: Anticonvulsants - gabapentin Passed - 05/26/2020 11:31 AM      Passed - Valid encounter within last 12 months    Recent Outpatient Visits          1 month ago Lumbar pain with radiation down right leg   [place], [place], NP   6 months ago Anxiety and depression   [place], [place], [place]   8 months ago Epigastric pain   [place], [place], MD   8 months ago Controlled type 2 diabetes mellitus without complication, without long-term current use of insulin ([place])   [place] F[place], [place], MD   10 months ago Uncontrolled type 2 diabetes mellitus with hyperglycemia [facility])   [place], Patrick E, MD

## 2020-06-04 ENCOUNTER — Other Ambulatory Visit: Payer: Self-pay | Admitting: Family

## 2020-06-04 DIAGNOSIS — E119 Type 2 diabetes mellitus without complications: Secondary | ICD-10-CM

## 2020-06-04 DIAGNOSIS — Z794 Long term (current) use of insulin: Secondary | ICD-10-CM

## 2020-06-04 DIAGNOSIS — E1169 Type 2 diabetes mellitus with other specified complication: Secondary | ICD-10-CM

## 2020-06-04 MED FILL — ALBUTEROL SULFATE HFA 108 (: 108 (90 BAS | 25 days supply | Qty: 18 | Fill #3

## 2020-06-04 MED FILL — ?CITALOPRAM HBR 40 MG TABLE: 40 | 30 days supply | Qty: 30 | Fill #2

## 2020-06-04 MED FILL — METFORMIN HCL 1000 MG TABS: 1000 | 30 days supply | Qty: 60 | Fill #2

## 2020-06-04 MED FILL — GABAPENTIN 300 MG CAPSULE: 300 | 30 days supply | Qty: 90 | Fill #0

## 2020-06-04 MED FILL — $LANTUS 100 UNITS/ML VIAL: 100 | 28 days supply | Qty: 10 | Fill #3

## 2020-06-04 NOTE — Telephone Encounter (Signed)
Refill request

## 2020-06-05 ENCOUNTER — Other Ambulatory Visit: Payer: Self-pay | Admitting: Family Medicine

## 2020-06-06 MED FILL — $TRULICITY 0.75 MG/0.5 ML P: 0.75 | 84 days supply | Qty: 6 | Fill #0

## 2020-06-06 MED FILL — ?ATORVASTATIN 20 MG TABLET: 20 | 30 days supply | Qty: 30 | Fill #0

## 2020-06-25 MED FILL — ALBUTEROL SULFATE HFA 108 (: 108 (90 BAS | 25 days supply | Qty: 18 | Fill #4

## 2020-07-04 ENCOUNTER — Other Ambulatory Visit: Payer: Self-pay | Admitting: Internal Medicine

## 2020-07-04 ENCOUNTER — Other Ambulatory Visit: Payer: Self-pay

## 2020-07-04 ENCOUNTER — Ambulatory Visit: Payer: Self-pay | Attending: Internal Medicine | Admitting: Internal Medicine

## 2020-07-04 DIAGNOSIS — M25841 Other specified joint disorders, right hand: Secondary | ICD-10-CM

## 2020-07-04 DIAGNOSIS — G894 Chronic pain syndrome: Secondary | ICD-10-CM

## 2020-07-04 MED ORDER — GABAPENTIN 300 MG PO CAPS: 300.0000 mg | ORAL_CAPSULE | Freq: Three times a day (TID) | ORAL | 3 refills | Status: DC

## 2020-07-04 MED ORDER — LIDOCAINE 5 % EX PTCH: 1.0000 | MEDICATED_PATCH | CUTANEOUS | 1 refills | Status: DC

## 2020-07-04 MED FILL — LIDOCAINE PATCH 5%: 5 | 15 days supply | Qty: 15 | Fill #0

## 2020-07-04 MED FILL — GABAPENTIN 300 MG CAPSULE: 300 | 30 days supply | Qty: 90 | Fill #0

## 2020-07-04 NOTE — Progress Notes (Signed)
Pt states he has knot on his right middle finger

## 2020-07-04 NOTE — Progress Notes (Signed)
Virtual Visit via Telephone Note  I connected with Mark Ferrell on 07/04/20 at 2:42 p.m by telephone and verified that I am speaking with the correct person using two identifiers.  Location: Patient: home Provider: office   I discussed the limitations, risks, security and privacy concerns of performing an evaluation and management service by telephone and the availability of in person appointments. I also discussed with the patient that there may be a patient responsible charge related to this service. The patient expressed understanding and agreed to proceed.   History of Present Illness: Patient with history of diabetes type 2, HL, anxiety/depression disorder, chronic pain, opioid dependence.  Previous PCP was Dr. Chapman Fitch.  [facility] visit for back pain.  Patient complains of chronic pain in the lower back that is a little worse recently.  He is started working part-time and is having to stand on his feet more than he normally would.  Pain in the lower back is in the lumbar region and more so on the right side.  Sometimes goes down the right leg.  Reports work-related injury in 2019 where he fractured his pelvis, right elbow and some ribs.  He was going to a pain clinic for about 8 years thereafter but subsequently lost insurance.  He was paying out-of-pocket for a while but then was no longer able to.  States that ever since then he has had problems getting the doctors here to prescribe narcotic medications for him and he feels that is what he needs.  States that he just wants to function and get through his day.  He tells me that he is also working on trying to get disability and has hired a Chief Executive Officer to help with that. -He tells me that he did not care for Dr. Joya Gaskins.  He felt he did not want to help him. He is wanting refill on gabapentin and a narcotic medication for him to have an use as needed. -I note that he is seen Dr. Lorin Mercy earlier this year after having an MRI that revealed no compressive  lesions.  He has also been referred to the pain clinic after getting orange card and is no showed 3 times.    Also complains of noticing a small knot on the right middle finger over the knuckles.  It is about the size of an eraser on a #2 pencil.  It has been there for 2 to 3 months.  It is not red.  Sometimes he has problems bending his knuckles because of it.  Outpatient Encounter Medications as of 07/04/2020  Medication Sig  . albuterol (VENTOLIN HFA) 108 (90 Base) MCG/ACT inhaler Inhale 2 puffs into the lungs every 6 (six) hours as needed for wheezing or shortness of breath.  Marland Kitchen atorvastatin (LIPITOR) 20 MG tablet TAKE 1 TABLET (20 MG TOTAL) BY MOUTH DAILY.  . blood glucose meter kit and supplies KIT Dispense based on patient and insurance preference. Use up to four times daily as directed. (FOR ICD-9 250.00, 250.01).  . Blood Glucose Monitoring Suppl (TRUE METRIX METER) w/Device KIT Use as directed to check blood sugar daily.  . busPIRone (BUSPAR) 15 MG tablet Take 1 tablet (15 mg total) by mouth 3 (three) times daily.  . citalopram (CELEXA) 40 MG tablet Take 1 tablet (40 mg total) by mouth daily.  Marland Kitchen gabapentin (NEURONTIN) 300 MG capsule TAKE 1 CAPSULE (300 MG TOTAL) BY MOUTH 3 (THREE) TIMES DAILY.  Marland Kitchen glucose blood (TRUE METRIX BLOOD GLUCOSE TEST) test strip Use as  instructed to check blood sugar daily.  . insulin glargine (LANTUS) 100 UNIT/ML injection Inject 0.35 mLs (35 Units total) into the skin at bedtime.  . Lactulose 20 GM/30ML SOLN Take 30 ml at bedtime and up to twice daily as needed (Patient not taking: Reported on 04/15/2020)  . metFORMIN (GLUCOPHAGE) 1000 MG tablet Take 1 tablet (1,000 mg total) by mouth 2 (two) times daily with a meal.  . omeprazole (PRILOSEC) 40 MG capsule Take 1 capsule (40 mg total) by mouth 2 (two) times daily.  Marland Kitchen oxyCODONE-acetaminophen (PERCOCET) 10-325 MG per tablet Take 1 tablet by mouth every 4 (four) hours as needed for pain.  (Patient not taking: Reported  on 04/15/2020)  . TRUEplus Lancets 28G MISC Use as instructed to test blood sugar daily.  . TRULICITY 0.25 EN/2.7PO SOPN INJECT 0.75 MG INTO THE SKIN ONCE A WEEK.   No facility-administered encounter medications on file as of 07/04/2020.      Observations/Objective: No direct observation done as this was a telephone encounter.  Assessment and Plan: 1. Chronic pain syndrome Advised patient that I have reviewed his chart.  I see that he had an MRI earlier this year that was reviewed by Dr. Lorin Mercy and was found to have no compressive lesions.  He also has been referred to the pain clinic several times for which she no showed despite having the orange card/cone discount. -Advised patient that given his history he probably will never be 100% pain-free so that is not a realistic goal.  Not a good idea to maintain people on strong narcotic medications for years because they do develop dependency or addiction and also develop hyperalgesia -Recommend if he is agreeable referral to our LCSW to be taught some pain coping skills.  He is agreeable to doing so I have refilled gabapentin.  I told him that we can also try lidocaine patch for him to use whenever he feels he has a flareup going on but I would advise against using it every day.  I went over with him how to use the lidocaine patch.  He will apply it for 12 hours and then remove it for 12 hours. - gabapentin (NEURONTIN) 300 MG capsule; Take 1 capsule (300 mg total) by mouth 3 (three) times daily.  Dispense: 90 capsule; Refill: 3 - lidocaine (LIDODERM) 5 %; Place 1 patch onto the skin daily. Remove & Discard patch within 12 hours or as directed by MD  Dispense: 15 patch; Refill: 1  2. Cyst of joint of right hand I recommend referral to orthopedics or we can observe for now.  Patient opted for the latter.  We will take a look at it on his next in person visit.   Follow Up Instructions:    I discussed the assessment and treatment plan with the  patient. The patient was provided an opportunity to ask questions and all were answered. The patient agreed with the plan and demonstrated an understanding of the instructions.   The patient was advised to call back or seek an in-person evaluation if the symptoms worsen or if the condition fails to improve as anticipated.  I provided 19 minutes of non-face-to-face time during this encounter.   Karle Plumber, MD

## 2020-08-08 ENCOUNTER — Other Ambulatory Visit: Payer: Self-pay | Admitting: Internal Medicine

## 2020-08-08 MED ORDER — CITALOPRAM HYDROBROMIDE 40 MG PO TABS: 40.0000 mg | ORAL_TABLET | Freq: Every day | ORAL | 3 refills | Status: DC

## 2020-08-08 MED FILL — ?CITALOPRAM HBR 40 MG TABLE: 40 | 30 days supply | Qty: 30 | Fill #3

## 2020-08-08 MED FILL — GABAPENTIN 300 MG CAPSULE: 300 | 30 days supply | Qty: 90 | Fill #1

## 2020-08-08 MED FILL — $LANTUS 100 UNITS/ML VIAL: 100 | 28 days supply | Qty: 10 | Fill #4

## 2020-08-08 NOTE — Telephone Encounter (Signed)
Medication Refill - Medication: citalopram (CELEXA) 40 MG tablet    Has the patient contacted their pharmacy? yes (Agent: If no, request that the patient contact the pharmacy for the refill.) (Agent: If yes, when and what did the pharmacy advise?)Contact PCP  Preferred Pharmacy (with phone number or street name):  [place], [place] [address] Phone:  [phone]  Fax:  [phone]       Agent: Please be advised that RX refills may take up to 3 business days. We ask that you follow-up with your pharmacy.

## 2020-08-14 ENCOUNTER — Other Ambulatory Visit: Payer: Self-pay | Admitting: Internal Medicine

## 2020-08-14 ENCOUNTER — Other Ambulatory Visit: Payer: Self-pay | Admitting: Family Medicine

## 2020-08-14 DIAGNOSIS — E1169 Type 2 diabetes mellitus with other specified complication: Secondary | ICD-10-CM

## 2020-08-14 DIAGNOSIS — E785 Hyperlipidemia, unspecified: Secondary | ICD-10-CM

## 2020-08-14 MED FILL — ATORVASTATIN CALCIUM 20 MG: 20 | 30 days supply | Qty: 30 | Fill #0

## 2020-08-14 MED FILL — ALBUTEROL SULFATE HFA 108 (: 108 (90 BAS | 25 days supply | Qty: 18 | Fill #4

## 2020-08-14 MED FILL — LIDOCAINE PATCH 5%: 5 | 30 days supply | Qty: 30 | Fill #0

## 2020-08-14 NOTE — Telephone Encounter (Signed)
Last seen by Dr. Johnson 

## 2020-09-09 MED FILL — GABAPENTIN 300 MG CAPSULE: 300 | 30 days supply | Qty: 90 | Fill #2

## 2020-09-27 ENCOUNTER — Emergency Department (HOSPITAL_COMMUNITY): Payer: Self-pay

## 2020-09-27 ENCOUNTER — Emergency Department (HOSPITAL_COMMUNITY)
Admission: EM | Admit: 2020-09-27 | Discharge: 2020-09-28 | Disposition: A | Discharge status: Home / Self Care | Payer: Self-pay | Attending: Emergency Medicine | Admitting: Emergency Medicine

## 2020-09-27 ENCOUNTER — Encounter (HOSPITAL_COMMUNITY): Payer: Self-pay | Admitting: Emergency Medicine

## 2020-09-27 DIAGNOSIS — E1169 Type 2 diabetes mellitus with other specified complication: Secondary | ICD-10-CM | POA: Insufficient documentation

## 2020-09-27 DIAGNOSIS — I10 Essential (primary) hypertension: Secondary | ICD-10-CM | POA: Insufficient documentation

## 2020-09-27 DIAGNOSIS — R464 Slowness and poor responsiveness: Secondary | ICD-10-CM | POA: Insufficient documentation

## 2020-09-27 DIAGNOSIS — T402X1A Poisoning by other opioids, accidental (unintentional), initial encounter: Secondary | ICD-10-CM | POA: Insufficient documentation

## 2020-09-27 DIAGNOSIS — S0001XA Abrasion of scalp, initial encounter: Secondary | ICD-10-CM | POA: Insufficient documentation

## 2020-09-27 DIAGNOSIS — T40601A Poisoning by unspecified narcotics, accidental (unintentional), initial encounter: Secondary | ICD-10-CM

## 2020-09-27 DIAGNOSIS — Z79899 Other long term (current) drug therapy: Secondary | ICD-10-CM | POA: Insufficient documentation

## 2020-09-27 DIAGNOSIS — E785 Hyperlipidemia, unspecified: Secondary | ICD-10-CM | POA: Insufficient documentation

## 2020-09-27 DIAGNOSIS — Z87891 Personal history of nicotine dependence: Secondary | ICD-10-CM | POA: Insufficient documentation

## 2020-09-27 DIAGNOSIS — Z7984 Long term (current) use of oral hypoglycemic drugs: Secondary | ICD-10-CM | POA: Insufficient documentation

## 2020-09-27 DIAGNOSIS — W01198A Fall on same level from slipping, tripping and stumbling with subsequent striking against other object, initial encounter: Secondary | ICD-10-CM | POA: Insufficient documentation

## 2020-09-27 DIAGNOSIS — Z794 Long term (current) use of insulin: Secondary | ICD-10-CM | POA: Insufficient documentation

## 2020-09-27 DIAGNOSIS — T424X1A Poisoning by benzodiazepines, accidental (unintentional), initial encounter: Secondary | ICD-10-CM | POA: Insufficient documentation

## 2020-09-27 DIAGNOSIS — S80212A Abrasion, left knee, initial encounter: Secondary | ICD-10-CM | POA: Insufficient documentation

## 2020-09-27 LAB — CBC WITH DIFFERENTIAL/PLATELET
Abs Immature Granulocytes: 0.05 10*3/uL (ref 0.00–0.07)
Basophils Absolute: 0 10*3/uL (ref 0.0–0.1)
Basophils Relative: 0 %
Eosinophils Absolute: 0.1 10*3/uL (ref 0.0–0.5)
Eosinophils Relative: 0 %
HCT: 44.7 % (ref 39.0–52.0)
Hemoglobin: 14.4 g/dL (ref 13.0–17.0)
Immature Granulocytes: 0 %
Lymphocytes Relative: 8 %
Lymphs Abs: 1.3 10*3/uL (ref 0.7–4.0)
MCH: 29.8 pg (ref 26.0–34.0)
MCHC: 32.2 g/dL (ref 30.0–36.0)
MCV: 92.4 fL (ref 80.0–100.0)
Monocytes Absolute: 0.7 10*3/uL (ref 0.1–1.0)
Monocytes Relative: 5 %
Neutro Abs: 13 10*3/uL — ABNORMAL HIGH (ref 1.7–7.7)
Neutrophils Relative %: 87 %
Platelets: 216 10*3/uL (ref 150–400)
RBC: 4.84 MIL/uL (ref 4.22–5.81)
RDW: 11.9 % (ref 11.5–15.5)
WBC: 15.1 10*3/uL — ABNORMAL HIGH (ref 4.0–10.5)
nRBC: 0 % (ref 0.0–0.2)

## 2020-09-27 LAB — COMPREHENSIVE METABOLIC PANEL
ALT: 46 U/L — ABNORMAL HIGH (ref 0–44)
AST: 47 U/L — ABNORMAL HIGH (ref 15–41)
Albumin: 3.6 g/dL (ref 3.5–5.0)
Alkaline Phosphatase: 60 U/L (ref 38–126)
Anion gap: 9 (ref 5–15)
BUN: 14 mg/dL (ref 6–20)
CO2: 28 mmol/L (ref 22–32)
Calcium: 8.7 mg/dL — ABNORMAL LOW (ref 8.9–10.3)
Chloride: 99 mmol/L (ref 98–111)
Creatinine, Ser: 0.94 mg/dL (ref 0.61–1.24)
GFR, Estimated: >60 mL/min (ref >60)
Glucose, Bld: 262 mg/dL — ABNORMAL HIGH (ref 70–99)
Potassium: 4.5 mmol/L (ref 3.5–5.1)
Sodium: 136 mmol/L (ref 135–145)
Total Bilirubin: 0.7 mg/dL (ref 0.3–1.2)
Total Protein: 6.9 g/dL (ref 6.5–8.1)

## 2020-09-27 LAB — ACETAMINOPHEN LEVEL: Acetaminophen (Tylenol), Serum: <10 ug/mL — ABNORMAL LOW (ref 10–30)

## 2020-09-27 LAB — ETHANOL: Alcohol, Ethyl (B): <10 mg/dL (ref <10)

## 2020-09-27 LAB — SALICYLATE LEVEL: Salicylate Lvl: <7 mg/dL — ABNORMAL LOW (ref 7.0–30.0)

## 2020-09-27 MED ORDER — SODIUM CHLORIDE 0.9 % IV BOLUS
1000.0000 mL | Freq: Once | INTRAVENOUS | Status: AC
  Administered 2020-09-27: 1000 mL via INTRAVENOUS

## 2020-09-27 MED ORDER — ONDANSETRON HCL 4 MG/2ML IJ SOLN
4.0000 mg | Freq: Once | INTRAMUSCULAR | Status: AC
  Administered 2020-09-27: 4 mg via INTRAVENOUS
  Filled 2020-09-27: qty 2

## 2020-09-27 NOTE — ED Provider Notes (Signed)
[facility] EMERGENCY DEPARTMENT Provider Note   CSN: [id number] Arrival date & time: 09/27/20  2133     History Chief Complaint  Patient presents with  . Drug Overdose    EDIE DARLEY is a 50 y.o. male hx of DM, HTN, chronic back pain here presenting with drug overdose.  Patient states that he has chronic back pain and he has been purchasing Xanax and oxycodone from the streets.  He states that he took Percocet and Xanax today.  He apparently fell and hit his head.  He also was noted to have a left knee abrasion.  He was noted to be very sleepy and EMS noted that he was hypoxic to 86%.  He was given Narcan prior to arrival.  He states that now he is vomiting and nauseated.  The history is provided by the patient.       Past Medical History:  Diagnosis Date  . Anxiety   . Arthritis   . Colon polyps 01/04/2020   Removal of multiple colon polyps during colonoscopy 12/2019- Dr. [place]  . Depression   . Diabetes mellitus without complication ([place]) 50/93/2671  . Diverticulosis   . GERD (gastroesophageal reflux disease)   . Hyperlipidemia   . Hypertension     Patient Active Problem List   Diagnosis Date Noted  . Colon polyps 01/04/2020  . Opioid dependence ([place]) 07/02/2019  . Low back pain 07/02/2019  . Hyperlipidemia associated with type 2 diabetes mellitus ([place]) 04/17/2019  . Controlled type 2 diabetes mellitus ([place]) 04/16/2019  . Anxiety and depression 04/16/2019  . Chronic pain 04/16/2019    Past Surgical History:  Procedure Laterality Date  . BONY PELVIS SURGERY    . HUMERUS FRACTURE SURGERY Right 2009       Family History  Problem Relation Age of Onset  . Hypertension Mother   . Diabetes Mother   . Lung cancer Father   . Cancer Brother        in his sinus's area and head  . Diabetes Paternal Grandfather   . Colon polyps Neg Hx   . Esophageal cancer Neg Hx   . Rectal cancer Neg Hx   . Stomach cancer Neg Hx     Social  History   Tobacco Use  . Smoking status: Former Research scientist (life sciences)  . Smokeless tobacco: Never Used  Vaping Use  . Vaping Use: Never used  Substance Use Topics  . Alcohol use: Yes    Alcohol/week: 0.0 standard drinks    Comment: rarely  . Drug use: Yes    Types: Marijuana    Comment: last use 2-3 days ago    Home Medications Prior to Admission medications   Medication Sig Start Date End Date Taking? Authorizing Provider  albuterol (VENTOLIN HFA) 108 (90 Base) MCG/ACT inhaler Inhale 2 puffs into the lungs every 6 (six) hours as needed for wheezing or shortness of breath. 09/27/19   Fulp, Cammie, MD  atorvastatin (LIPITOR) 20 MG tablet TAKE 1 TABLET (20 MG TOTAL) BY MOUTH DAILY. 08/14/20   Ladell Pier, MD  blood glucose meter kit and supplies KIT Dispense based on patient and insurance preference. Use up to four times daily as directed. (FOR ICD-9 250.00, 250.01). 04/02/19   Lucrezia Starch, MD  Blood Glucose Monitoring Suppl (TRUE METRIX METER) w/Device KIT Use as directed to check blood sugar daily. 04/15/20   Fulp, Cammie, MD  busPIRone (BUSPAR) 15 MG tablet Take 1 tablet (15 mg total) by  mouth 3 (three) times daily. 11/14/19   Argentina Donovan, PA-C  citalopram (CELEXA) 40 MG tablet Take 1 tablet (40 mg total) by mouth daily. 08/08/20   Ladell Pier, MD  gabapentin (NEURONTIN) 300 MG capsule Take 1 capsule (300 mg total) by mouth 3 (three) times daily. 07/04/20   Ladell Pier, MD  glucose blood (TRUE METRIX BLOOD GLUCOSE TEST) test strip Use as instructed to check blood sugar daily. 04/15/20   Fulp, Cammie, MD  insulin glargine (LANTUS) 100 UNIT/ML injection Inject 0.35 mLs (35 Units total) into the skin at bedtime. 04/15/20   Camillia Herter, NP  Lactulose 20 GM/30ML SOLN Take 30 ml at bedtime and up to twice daily as needed Patient not taking: Reported on 04/15/2020 09/27/19   Fulp, Cammie, MD  lidocaine (LIDODERM) 5 % Place 1 patch onto the skin daily. Remove & Discard patch within  12 hours or as directed by MD 07/04/20   Ladell Pier, MD  metFORMIN (GLUCOPHAGE) 1000 MG tablet Take 1 tablet (1,000 mg total) by mouth 2 (two) times daily with a meal. 04/15/20   Camillia Herter, NP  omeprazole (PRILOSEC) 40 MG capsule Take 1 capsule (40 mg total) by mouth 2 (two) times daily. 09/06/19   Fulp, Cammie, MD  oxyCODONE-acetaminophen (PERCOCET) 10-325 MG per tablet Take 1 tablet by mouth every 4 (four) hours as needed for pain.  Patient not taking: Reported on 04/15/2020    [provider]  TRUEplus Lancets 28G MISC Use as instructed to test blood sugar daily. 04/15/20   Fulp, Cammie, MD  TRULICITY 0.92 HV/7.4BB SOPN INJECT 0.75 MG INTO THE SKIN ONCE A WEEK. 06/05/20   Fulp, Cammie, MD    Allergies    Penicillins  Review of Systems   Review of Systems  Psychiatric/Behavioral: Positive for confusion.  All other systems reviewed and are negative.   Physical Exam Updated Vital Signs BP 109/75 (BP Location: Left Arm)   Pulse 69   Temp 98.4 F (36.9 C)   Resp 19   SpO2 97%   Physical Exam Vitals and nursing note reviewed.  Constitutional:      Appearance: Normal appearance.     Comments: Slightly sleepy and vomited  HENT:     Head: Normocephalic.     Comments: Abrasion on the left scalp    Nose: Nose normal.     Mouth/Throat:     Mouth: Mucous membranes are dry.  Eyes:     Extraocular Movements: Extraocular movements intact.     Pupils: Pupils are equal, round, and reactive to light.  Cardiovascular:     Rate and Rhythm: Normal rate and regular rhythm.     Pulses: Normal pulses.     Heart sounds: Normal heart sounds.  Pulmonary:     Effort: Pulmonary effort is normal.     Breath sounds: Normal breath sounds.  Abdominal:     General: Abdomen is flat.     Palpations: Abdomen is soft.  Musculoskeletal:        General: Normal range of motion.     Cervical back: Normal range of motion and neck supple.     Comments: Abrasion on the left knee but  normal range of motion  Skin:    General: Skin is warm.     Capillary Refill: Capillary refill takes less than 2 seconds.  Neurological:     Comments: Slightly sleepy but able to ambulate  Psychiatric:  Mood and Affect: Mood normal.        Behavior: Behavior normal.     ED Results / Procedures / Treatments   Labs (all labs ordered are listed, but only abnormal results are displayed) Labs Reviewed  CBC WITH DIFFERENTIAL/PLATELET  COMPREHENSIVE METABOLIC PANEL  ETHANOL  SALICYLATE LEVEL  ACETAMINOPHEN LEVEL  RAPID URINE DRUG SCREEN, HOSP PERFORMED    EKG None  Radiology No results found.  Procedures Procedures   Medications Ordered in ED Medications  ondansetron (ZOFRAN) injection 4 mg (has no administration in time range)  sodium chloride 0.9 % bolus 1,000 mL (has no administration in time range)    ED Course  I have reviewed the triage vital signs and the nursing notes.  Pertinent labs & imaging results that were available during my care of the patient were reviewed by me and considered in my medical decision making (see chart for details).    MDM Rules/Calculators/A&P                         Garin Mata Carll is a 50 y.o. male here presenting with fall and drug overdose.  Patient has chronic pain and has been purchasing Xanax and oxycodone from the street.  Patient was given Narcan prior to arrival.  He is now vomiting.  Will give Zofran. Will hydrate patient and get tox and CBC and CMP.   11:15 PM WBC is 15.  CT head is unremarkable.  X-ray of the knee was unremarkable.  Anticipate observation for about 3 hours and if patient does not require any oxygen, likely can be discharged home. He is not currently suicidal or homicidal. May need social work since he has insurance issues.   Final Clinical Impression(s) / ED Diagnoses Final diagnoses:  None    Rx / DC Orders ED Discharge Orders    None       Drenda Freeze, MD 09/27/20 2316

## 2020-09-27 NOTE — ED Triage Notes (Signed)
Pt transported from home by family after taking "a few percocet" and xanax, he bought on the street. Pt reports this was for pain control and not SI. Per family pt went unresponsive, had to be given Narcan 3mg  by EMS, pt had fall abrasion noted to L knee, L side of face. Pt falling asleep during triage, sat down to 86%, placed on 2 L 02 by [place].

## 2020-09-27 NOTE — ED Notes (Signed)
Patient provided urinal, aware we need urine.

## 2020-09-28 LAB — RAPID URINE DRUG SCREEN, HOSP PERFORMED
Amphetamines: NOT DETECTED
Barbiturates: NOT DETECTED
Benzodiazepines: POSITIVE — AB
Cocaine: NOT DETECTED
Opiates: POSITIVE — AB
Tetrahydrocannabinol: NOT DETECTED

## 2020-09-28 NOTE — ED Notes (Signed)
Assumed care of this patient. A&Ox4. Respirations regular/unlabored. Patient able to speak full sentences. Connected to cardiac monitor, bp, pulse ox. Stretcher low, wheels locked, call bell within reach. Family at bedside.

## 2020-09-28 NOTE — ED Provider Notes (Signed)
Patient signed out to me by Dr. Darl Householder.  Patient seen earlier after unintentional opiate overdose.  Patient has been monitored and has progressively improved.  He has now awake and alert.  He confirms that this was unintentional, no suicidality.  Asking for information about rehab.   Orpah Greek, MD 09/28/20 [phone]

## 2020-10-25 ENCOUNTER — Other Ambulatory Visit: Payer: Self-pay

## 2020-11-17 ENCOUNTER — Other Ambulatory Visit: Payer: Self-pay | Admitting: Family

## 2020-11-17 ENCOUNTER — Other Ambulatory Visit: Payer: Self-pay

## 2020-11-17 DIAGNOSIS — Z794 Long term (current) use of insulin: Secondary | ICD-10-CM

## 2020-11-19 NOTE — Telephone Encounter (Signed)
Please schedule appointment for refills, evaluation, and management.

## 2020-11-24 ENCOUNTER — Other Ambulatory Visit: Payer: Self-pay | Admitting: Family

## 2020-11-24 ENCOUNTER — Other Ambulatory Visit: Payer: Self-pay

## 2020-11-24 DIAGNOSIS — E119 Type 2 diabetes mellitus without complications: Secondary | ICD-10-CM

## 2020-11-24 DIAGNOSIS — Z794 Long term (current) use of insulin: Secondary | ICD-10-CM

## 2020-12-03 ENCOUNTER — Other Ambulatory Visit: Payer: Self-pay

## 2020-12-24 ENCOUNTER — Other Ambulatory Visit: Payer: Self-pay

## 2020-12-24 ENCOUNTER — Encounter: Payer: Self-pay | Admitting: Physician Assistant

## 2020-12-24 ENCOUNTER — Ambulatory Visit: Payer: Self-pay | Attending: Physician Assistant | Admitting: Physician Assistant

## 2020-12-24 DIAGNOSIS — G8929 Other chronic pain: Secondary | ICD-10-CM

## 2020-12-24 DIAGNOSIS — G894 Chronic pain syndrome: Secondary | ICD-10-CM

## 2020-12-24 DIAGNOSIS — E119 Type 2 diabetes mellitus without complications: Secondary | ICD-10-CM

## 2020-12-24 DIAGNOSIS — M62838 Other muscle spasm: Secondary | ICD-10-CM

## 2020-12-24 DIAGNOSIS — M545 Low back pain, unspecified: Secondary | ICD-10-CM

## 2020-12-24 DIAGNOSIS — Z794 Long term (current) use of insulin: Secondary | ICD-10-CM

## 2020-12-24 DIAGNOSIS — M25511 Pain in right shoulder: Secondary | ICD-10-CM

## 2020-12-24 MED ORDER — GABAPENTIN 300 MG PO CAPS
300.0000 mg | ORAL_CAPSULE | Freq: Three times a day (TID) | ORAL | 0 refills | Status: DC | PRN
  Filled 2020-12-24 – 2021-01-02 (×2): qty 90, 30d supply, fill #0

## 2020-12-24 MED ORDER — INSULIN GLARGINE 100 UNIT/ML SOLOSTAR PEN
35.0000 [IU] | PEN_INJECTOR | Freq: Every day | SUBCUTANEOUS | 1 refills | Status: DC
  Filled 2020-12-24: qty 9, 25d supply, fill #0
  Filled 2020-12-24: qty 12, 34d supply, fill #0
  Filled 2021-01-02: qty 15, 42d supply, fill #0
  Filled 2021-01-02: qty 6, 17d supply, fill #0

## 2020-12-24 MED ORDER — DULAGLUTIDE 0.75 MG/0.5ML ~~LOC~~ SOAJ
0.7500 mg | SUBCUTANEOUS | 0 refills | Status: DC
  Filled 2020-12-24 – 2021-01-02 (×2): qty 2, 28d supply, fill #0

## 2020-12-24 MED ORDER — METHOCARBAMOL 500 MG PO TABS
1000.0000 mg | ORAL_TABLET | Freq: Three times a day (TID) | ORAL | 0 refills | Status: DC | PRN
  Filled 2020-12-24 – 2021-01-02 (×2): qty 90, 15d supply, fill #0

## 2020-12-24 MED ORDER — NAPROXEN 500 MG PO TABS
500.0000 mg | ORAL_TABLET | Freq: Two times a day (BID) | ORAL | 0 refills | Status: AC
  Filled 2020-12-24 – 2021-01-02 (×2): qty 30, 15d supply, fill #0

## 2020-12-24 NOTE — Progress Notes (Signed)
Virtual Visit via Telephone Note  I connected with [name] on 12/24/20 at 10:30 AM EDT by telephone and verified that I am speaking with the correct person using two identifiers.  Location: Patient: work Provider: [facility] office   I discussed the limitations, risks, security and privacy concerns of performing an evaluation and management service by telephone and the availability of in person appointments. I also discussed with the patient that there may be a patient responsible charge related to this service. The patient expressed understanding and agreed to proceed.   History of Present Illness: R shoulder pain for about 6 weeks.  NKI.  Also having low back pain.  Out of gabapentin.  And actually not taking any of his meds and not checking blood sugars.  He does not need RF of metformin bc he he has 2 bottles at home.    He has not kept follow-up appts and could not come in person today.  Denies polyuria/polydipsia.  Has financial problems and no insurance and needs to work.  Currently applying for disability.     Observations/Objective:  NAD.  Pressured speech.  A&Ox3   Assessment and Plan: 1. Acute pain of right shoulder - gabapentin (NEURONTIN) 300 MG capsule; Take 1 capsule (300 mg total) by mouth 3 (three) times daily as needed (pain).  Dispense: 90 capsule; Refill: 0 - methocarbamol (ROBAXIN) 500 MG tablet; Take 2 tablets (1,000 mg total) by mouth every 8 (eight) hours as needed for muscle spasms.  Dispense: 90 tablet; Refill: 0 - naproxen (NAPROSYN) 500 MG tablet; Take 1 tablet (500 mg total) by mouth 2 (two) times daily with a meal.  Dispense: 30 tablet; Refill: 0  2. Muscle spasm  3. Chronic low back pain without sciatica, unspecified back pain laterality - gabapentin (NEURONTIN) 300 MG capsule; Take 1 capsule (300 mg total) by mouth 3 (three) times daily as needed (pain).  Dispense: 90 capsule; Refill: 0 - methocarbamol (ROBAXIN) 500 MG tablet; Take 2 tablets (1,000 mg  total) by mouth every 8 (eight) hours as needed for muscle spasms.  Dispense: 90 tablet; Refill: 0 - naproxen (NAPROSYN) 500 MG tablet; Take 1 tablet (500 mg total) by mouth 2 (two) times daily with a meal.  Dispense: 30 tablet; Refill: 0  4. Chronic pain syndrome - gabapentin (NEURONTIN) 300 MG capsule; Take 1 capsule (300 mg total) by mouth 3 (three) times daily as needed (pain).  Dispense: 90 capsule; Refill: 0  5. Controlled type 2 diabetes mellitus without complication, with long-term current use of insulin ([place]) resume - Dulaglutide 0.75 MG/0.5ML SOPN; Inject 0.75 mg into the skin every Monday.  Dispense: 3 mL; Refill: 0 - insulin glargine (LANTUS) 100 UNIT/ML Solostar Pen; Inject 35 Units into the skin daily.  Dispense: 15 mL; Refill: 1    Follow Up Instructions: See PCP within in 3-4 weeks IN OFFICE.  Needs A1C   I discussed the assessment and treatment plan with the patient. The patient was provided an opportunity to ask questions and all were answered. The patient agreed with the plan and demonstrated an understanding of the instructions.   The patient was advised to call back or seek an in-person evaluation if the symptoms worsen or if the condition fails to improve as anticipated.  I provided 13 minutes of non-face-to-face time during this encounter.   Freeman Caldron, PA-C  Patient ID: Mark Ferrell, male   DOB: [birth year], 50 y.o.   MRN: [id number]

## 2020-12-29 ENCOUNTER — Other Ambulatory Visit: Payer: Self-pay

## 2020-12-31 ENCOUNTER — Other Ambulatory Visit: Payer: Self-pay

## 2021-01-02 ENCOUNTER — Other Ambulatory Visit: Payer: Self-pay

## 2021-02-10 ENCOUNTER — Other Ambulatory Visit: Payer: Self-pay | Admitting: Internal Medicine

## 2021-02-10 ENCOUNTER — Other Ambulatory Visit: Payer: Self-pay

## 2021-02-10 DIAGNOSIS — E785 Hyperlipidemia, unspecified: Secondary | ICD-10-CM

## 2021-02-10 DIAGNOSIS — E1169 Type 2 diabetes mellitus with other specified complication: Secondary | ICD-10-CM

## 2021-02-10 MED ORDER — ATORVASTATIN CALCIUM 20 MG PO TABS
ORAL_TABLET | Freq: Every day | ORAL | 0 refills | Status: DC
  Filled 2021-02-10: qty 30, 30d supply, fill #0

## 2021-02-10 NOTE — Telephone Encounter (Signed)
Requested medication (s) are due for refill today:  no  Requested medication (s) are on the active medication list : yes  Last refill: 08/14/2020  Future visit scheduled: yes  Notes to clinic:  Patient has upcoming appt on 02/16/2021   Requested Prescriptions  Pending Prescriptions Disp Refills   atorvastatin (LIPITOR) 20 MG tablet 30 tablet 0    Sig: TAKE 1 TABLET (20 MG TOTAL) BY MOUTH DAILY.      Cardiovascular:  Antilipid - Statins Failed - 02/10/2021 10:15 AM      Failed - Total Cholesterol in normal range and within 360 days    Cholesterol, Total  Date Value Ref Range Status  07/02/2019 112 100 - 199 mg/dL Final          Failed - LDL in normal range and within 360 days    LDL Chol Calc (NIH)  Date Value Ref Range Status  07/02/2019 58 0 - 99 mg/dL Final          Failed - HDL in normal range and within 360 days    HDL  Date Value Ref Range Status  07/02/2019 39 (L) >39 mg/dL Final          Failed - Triglycerides in normal range and within 360 days    Triglycerides  Date Value Ref Range Status  07/02/2019 72 0 - 149 mg/dL Final          Passed - Patient is not pregnant      Passed - Valid encounter within last 12 months    Recent Outpatient Visits           1 month ago Acute pain of right shoulder   [place], [place]   7 months ago Chronic pain syndrome   [place], Deborah B, MD   10 months ago Lumbar pain with radiation down right leg   [place], [place], NP   1 year ago Anxiety and depression   [place], [place]   1 year ago Epigastric pain   [place], [place], MD       Future Appointments             In 6 days Gildardo Pounds, NP [place]

## 2021-02-16 ENCOUNTER — Other Ambulatory Visit: Payer: Self-pay

## 2021-02-16 ENCOUNTER — Encounter: Payer: Self-pay | Admitting: Nurse Practitioner

## 2021-02-16 ENCOUNTER — Other Ambulatory Visit (HOSPITAL_COMMUNITY)
Admission: RE | Admit: 2021-02-16 | Discharge: 2021-02-16 | Disposition: A | Discharge status: Home / Self Care | Payer: Medicaid Other | Source: Ambulatory Visit | Attending: Nurse Practitioner | Admitting: Nurse Practitioner

## 2021-02-16 ENCOUNTER — Ambulatory Visit: Payer: Self-pay | Attending: Nurse Practitioner | Admitting: Nurse Practitioner

## 2021-02-16 VITALS — BP 156/109 | HR 73 | Resp 16 | Ht 72.0 in | Wt 292.2 lb

## 2021-02-16 DIAGNOSIS — Z114 Encounter for screening for human immunodeficiency virus [HIV]: Secondary | ICD-10-CM

## 2021-02-16 DIAGNOSIS — B3749 Other urogenital candidiasis: Secondary | ICD-10-CM | POA: Diagnosis not present

## 2021-02-16 DIAGNOSIS — Z113 Encounter for screening for infections with a predominantly sexual mode of transmission: Secondary | ICD-10-CM | POA: Insufficient documentation

## 2021-02-16 DIAGNOSIS — G894 Chronic pain syndrome: Secondary | ICD-10-CM | POA: Insufficient documentation

## 2021-02-16 DIAGNOSIS — F32A Depression, unspecified: Secondary | ICD-10-CM

## 2021-02-16 DIAGNOSIS — M069 Rheumatoid arthritis, unspecified: Secondary | ICD-10-CM | POA: Insufficient documentation

## 2021-02-16 DIAGNOSIS — N481 Balanitis: Secondary | ICD-10-CM

## 2021-02-16 DIAGNOSIS — M545 Low back pain, unspecified: Secondary | ICD-10-CM

## 2021-02-16 DIAGNOSIS — J449 Chronic obstructive pulmonary disease, unspecified: Secondary | ICD-10-CM

## 2021-02-16 DIAGNOSIS — M152 Bouchard's nodes (with arthropathy): Secondary | ICD-10-CM

## 2021-02-16 DIAGNOSIS — E119 Type 2 diabetes mellitus without complications: Secondary | ICD-10-CM

## 2021-02-16 DIAGNOSIS — E1169 Type 2 diabetes mellitus with other specified complication: Secondary | ICD-10-CM

## 2021-02-16 DIAGNOSIS — D72829 Elevated white blood cell count, unspecified: Secondary | ICD-10-CM

## 2021-02-16 DIAGNOSIS — F419 Anxiety disorder, unspecified: Secondary | ICD-10-CM

## 2021-02-16 DIAGNOSIS — E1165 Type 2 diabetes mellitus with hyperglycemia: Secondary | ICD-10-CM | POA: Insufficient documentation

## 2021-02-16 DIAGNOSIS — G8929 Other chronic pain: Secondary | ICD-10-CM

## 2021-02-16 DIAGNOSIS — I10 Essential (primary) hypertension: Secondary | ICD-10-CM

## 2021-02-16 DIAGNOSIS — M25511 Pain in right shoulder: Secondary | ICD-10-CM

## 2021-02-16 DIAGNOSIS — Z791 Long term (current) use of non-steroidal anti-inflammatories (NSAID): Secondary | ICD-10-CM | POA: Insufficient documentation

## 2021-02-16 DIAGNOSIS — E118 Type 2 diabetes mellitus with unspecified complications: Secondary | ICD-10-CM

## 2021-02-16 DIAGNOSIS — Z794 Long term (current) use of insulin: Secondary | ICD-10-CM

## 2021-02-16 DIAGNOSIS — E785 Hyperlipidemia, unspecified: Secondary | ICD-10-CM

## 2021-02-16 DIAGNOSIS — Z79899 Other long term (current) drug therapy: Secondary | ICD-10-CM | POA: Insufficient documentation

## 2021-02-16 LAB — POCT GLYCOSYLATED HEMOGLOBIN (HGB A1C): HbA1c, POC (controlled diabetic range): 10.8 % — AB (ref 0.0–7.0)

## 2021-02-16 LAB — GLUCOSE, POCT (MANUAL RESULT ENTRY): POC Glucose: 421 mg/dl — AB (ref 70–99)

## 2021-02-16 MED ORDER — TRUEPLUS LANCETS 28G MISC
6 refills | Status: DC
Start: 2021-02-16 — End: 2022-04-01
  Filled 2021-02-16: qty 100, 25d supply, fill #0

## 2021-02-16 MED ORDER — TRUE METRIX BLOOD GLUCOSE TEST VI STRP
ORAL_STRIP | 5 refills | Status: DC
  Filled 2021-02-16: qty 100, 25d supply, fill #0

## 2021-02-16 MED ORDER — CLOTRIMAZOLE-BETAMETHASONE 1-0.05 % EX CREA
1.0000 "application " | TOPICAL_CREAM | Freq: Every day | CUTANEOUS | 0 refills | Status: AC
  Filled 2021-02-16: qty 30, 30d supply, fill #0

## 2021-02-16 MED ORDER — DAPAGLIFLOZIN PROPANEDIOL 5 MG PO TABS
5.0000 mg | ORAL_TABLET | Freq: Every day | ORAL | 3 refills | Status: DC
Start: 2021-02-16 — End: 2021-05-20
  Filled 2021-02-16: qty 30, 30d supply, fill #0

## 2021-02-16 MED ORDER — TRUE METRIX METER W/DEVICE KIT
PACK | 0 refills | Status: AC
  Filled 2021-02-16: qty 1, 30d supply, fill #0

## 2021-02-16 MED ORDER — LISINOPRIL 20 MG PO TABS
20.0000 mg | ORAL_TABLET | Freq: Every day | ORAL | 3 refills | Status: DC
  Filled 2021-02-16: qty 30, 30d supply, fill #0

## 2021-02-16 MED ORDER — ALBUTEROL SULFATE HFA 108 (90 BASE) MCG/ACT IN AERS
2.0000 | INHALATION_SPRAY | Freq: Four times a day (QID) | RESPIRATORY_TRACT | 1 refills | Status: DC | PRN
  Filled 2021-02-16: qty 18, 25d supply, fill #0

## 2021-02-16 MED ORDER — INSULIN GLARGINE 100 UNIT/ML SOLOSTAR PEN
40.0000 [IU] | PEN_INJECTOR | Freq: Every day | SUBCUTANEOUS | 1 refills | Status: DC
  Filled 2021-02-16: qty 12, 30d supply, fill #0

## 2021-02-16 MED ORDER — DULAGLUTIDE 0.75 MG/0.5ML ~~LOC~~ SOAJ
1.5000 mg | SUBCUTANEOUS | 1 refills | Status: DC
  Filled 2021-02-16: qty 2, 28d supply, fill #0

## 2021-02-16 MED ORDER — GABAPENTIN 300 MG PO CAPS
300.0000 mg | ORAL_CAPSULE | Freq: Three times a day (TID) | ORAL | 3 refills | Status: DC | PRN
  Filled 2021-02-16: qty 90, 30d supply, fill #0

## 2021-02-16 MED ORDER — CITALOPRAM HYDROBROMIDE 40 MG PO TABS
40.0000 mg | ORAL_TABLET | Freq: Every day | ORAL | 1 refills | Status: DC
  Filled 2021-02-16: qty 30, 30d supply, fill #0

## 2021-02-16 MED ORDER — ATORVASTATIN CALCIUM 20 MG PO TABS
20.0000 mg | ORAL_TABLET | Freq: Every day | ORAL | 3 refills | Status: DC
  Filled 2021-02-16: qty 90, 90d supply, fill #0
  Filled 2021-02-16: qty 30, 30d supply, fill #0

## 2021-02-16 NOTE — Progress Notes (Signed)
Assessment & Plan:  Mark Ferrell was seen today for diabetes and hypertension.  Diagnoses and all orders for this visit:  Controlled type 2 diabetes mellitus without complication, with long-term current use of insulin (HCC) -     POCT glucose (manual entry) -     POCT glycosylated hemoglobin (Hb A1C) -     Microalbumin / creatinine urine ratio -     insulin glargine (LANTUS) 100 UNIT/ML Solostar Pen; Inject 40 Units into the skin daily. -     Discontinue: Dulaglutide 0.75 MG/0.5ML SOPN; Inject 1.5 mg into the skin every Monday. -     glucose blood (TRUE METRIX BLOOD GLUCOSE TEST) test strip; Use as instructed to check blood sugar daily. -     dapagliflozin propanediol (FARXIGA) 5 MG TABS tablet; Take 1 tablet (5 mg total) by mouth daily before breakfast. NEEDS PASS -     Blood Glucose Monitoring Suppl (TRUE METRIX METER) w/Device KIT; Use as directed to check blood sugar daily. -     TRUEplus Lancets 28G MISC; Use as instructed to test blood sugar daily. -     CMP14+EGFR -     Dulaglutide (TRULICITY) 1.5 YI/5.0YD SOPN; Inject 1.5 mg into the skin once a week. Continue blood sugar control as discussed in office today, low carbohydrate diet, and regular physical exercise as tolerated, 150 minutes per week (30 min each day, 5 days per week, or 50 min 3 days per week). Keep blood sugar logs with fasting goal of 90-130 mg/dl, post prandial (after you eat) less than 180.  For Hypoglycemia: BS <60 and Hyperglycemia BS >400; contact the clinic ASAP. Annual eye exams and foot exams are recommended.   Acute pain of right shoulder -     gabapentin (NEURONTIN) 300 MG capsule; Take 1 capsule (300 mg total) by mouth 3 (three) times daily as needed (pain).  Chronic low back pain without sciatica, unspecified back pain laterality -     gabapentin (NEURONTIN) 300 MG capsule; Take 1 capsule (300 mg total) by mouth 3 (three) times daily as needed (pain). Work on losing weight to help reduce back pain. May  alternate with heat and ice application for pain relief. May also alternate with acetaminophen and Ibuprofen as prescribed for back pain. Other alternatives include massage, acupuncture and water aerobics.  You must stay active and avoid a sedentary lifestyle.    Chronic pain syndrome -     gabapentin (NEURONTIN) 300 MG capsule; Take 1 capsule (300 mg total) by mouth 3 (three) times daily as needed (pain).  Hyperlipidemia associated with type 2 diabetes mellitus (HCC) -     atorvastatin (LIPITOR) 20 MG tablet; Take 1 tablet (20 mg total) by mouth daily. INSTRUCTIONS: Work on a low fat, heart healthy diet and participate in regular aerobic exercise program by working out at least 150 minutes per week; 5 days a week-30 minutes per day. Avoid red meat/beef/steak,  fried foods. junk foods, sodas, sugary drinks, unhealthy snacking, alcohol and smoking.  Drink at least 80 oz of water per day and monitor your carbohydrate intake daily.    Chronic obstructive pulmonary disease, unspecified COPD type (HCC) -     albuterol (VENTOLIN HFA) 108 (90 Base) MCG/ACT inhaler; INHALE 2 PUFFS INTO THE LUNGS EVERY 6 (SIX) HOURS AS NEEDED FOR WHEEZING OR SHORTNESS OF BREATH.  Anxiety and depression -     citalopram (CELEXA) 40 MG tablet; Take 1 tablet (40 mg total) by mouth daily.  Bouchard nodes (DJD hand) -  Rheumatoid Arthritis Profile  Balanitis -     clotrimazole-betamethasone (LOTRISONE) cream; Apply 1 application topically daily. NEEDS PASS -     Urine cytology ancillary only -     sulfamethoxazole-trimethoprim (BACTRIM DS) 800-160 MG tablet; Take 1 tablet by mouth 2 (two) times daily for 5 days.  Encounter for screening for HIV -     HIV antibody (with reflex)  Leukocytosis, unspecified type -     CBC with Differential  Primary hypertension -     lisinopril (ZESTRIL) 20 MG tablet; Take 1 tablet (20 mg total) by mouth daily. -     CMP14+EGFR Continue all antihypertensives as prescribed.   Remember to bring in your blood pressure log with you for your follow up appointment.  DASH/Mediterranean Diets are healthier choices for HTN.     Patient has been counseled on age-appropriate routine health concerns for screening and prevention. These are reviewed and up-to-date. Referrals have been placed accordingly. Immunizations are up-to-date or declined.    Subjective:   Chief Complaint  Patient presents with   Diabetes   Hypertension   HPI Mark Ferrell 50 y.o. male presents to office today for follow up to HTN He has a past medical history of Anxiety, Arthritis, Colon polyps (01/04/2020), Depression, Diabetes mellitus without complication ([place]) (58/85/0277), Diverticulosis, GERD (gastroesophageal reflux disease), Hyperlipidemia, and Hypertension.    DM  Poorly controlled. He is not diet, exercise or medication adherent. Lantus 35 units daily. Stopped taking metformin due to GI side effects. Will increase Trulicity from 4.12 mg to 1.5 mg, add farxiga 5 mg and increase lantus to 40units daily. LDL at goal with atorvastatin 20 mg daily.  Lab Results  Component Value Date   HGBA1C 10.8 (A) 02/16/2021   Lab Results  Component Value Date   LDLCALC 58 07/02/2019     HTN Poorly controlled. Adding lisinopril 20 mg daily. Denies chest pain, shortness of breath, palpitations, lightheadedness, dizziness, headaches or BLE edema.   BP Readings from Last 3 Encounters:  02/16/21 (!) 156/109  09/28/20 121/64  04/15/20 134/89       Anxiety and Depression Will restart clelexa 40 mg daily. Denies any current thoughts of self harm.  Depression screen [facility] 2/9 02/16/2021 04/15/2020 09/27/2019 09/06/2019 04/16/2019  Decreased Interest '3 2 2 3 2  ' Down, Depressed, Hopeless '2 2 3 2 2  ' PHQ - 2 Score '5 4 5 5 4  ' Altered sleeping '3 2 3 3 2  ' Tired, decreased energy '2 2 3 2 1  ' Change in appetite '1 1 2 2 1  ' Feeling bad or failure about yourself  '2 3 3 3 3  ' Trouble concentrating '1 2 2 2 1  ' Moving  slowly or fidgety/restless '1 1 3 3 1  ' Suicidal thoughts 0 0 0 0 0  PHQ-9 Score '15 15 21 20 13  ' Difficult doing work/chores - Somewhat difficult - - Very difficult    GAD 7 : Generalized Anxiety Score 02/16/2021 04/15/2020 09/27/2019 09/06/2019  Nervous, Anxious, on Edge '2 2 3 2  ' Control/stop worrying '2 3 3 3  ' Worry too much - different things '2 2 3 3  ' Trouble relaxing '2 3 3 3  ' Restless '2 2 3 2  ' Easily annoyed or irritable '2 3 3 2  ' Afraid - awful might happen '2 1 2 2  ' Total GAD 7 Score '14 16 20 17  ' Anxiety Difficulty - Somewhat difficult - -   Balanitis Endorses penile irritation with redness and chaffing.  Joint/Muscle Pain: Patient complains of arthralgias for which has been present for several weeks. Pain is located in multiple joints, is described as aching, sharp, and throbbing, and is constant .  Associated symptoms include: decreased range of motion, deformity, and edema.  The patient has tried nothing for pain relief.  Related to injury:   no.    Review of Systems  Constitutional:  Negative for fever, malaise/fatigue and weight loss.  HENT: Negative.  Negative for nosebleeds.   Eyes: Negative.  Negative for blurred vision, double vision and photophobia.  Respiratory: Negative.  Negative for cough and shortness of breath.   Cardiovascular: Negative.  Negative for chest pain, palpitations and leg swelling.  Gastrointestinal: Negative.  Negative for heartburn, nausea and vomiting.  Genitourinary:        SEE HPI  Musculoskeletal:  Positive for back pain and joint pain. Negative for myalgias.  Neurological: Negative.  Negative for dizziness, focal weakness, seizures and headaches.  Psychiatric/Behavioral:  Positive for depression. Negative for suicidal ideas. The patient is nervous/anxious.    Past Medical History:  Diagnosis Date   Anxiety    Arthritis    Colon polyps 01/04/2020   Removal of multiple colon polyps during colonoscopy 12/2019- Dr. Thornton [place]   Depression     Diabetes mellitus without complication ([place]) 50/38/8828   Diverticulosis    GERD (gastroesophageal reflux disease)    Hyperlipidemia    Hypertension     Past Surgical History:  Procedure Laterality Date   BONY PELVIS SURGERY     HUMERUS FRACTURE SURGERY Right 2009    Family History  Problem Relation Age of Onset   Hypertension Mother    Diabetes Mother    Lung cancer Father    Cancer Brother        in his sinus's area and head   Diabetes Paternal Grandfather    Colon polyps Neg Hx    Esophageal cancer Neg Hx    Rectal cancer Neg Hx    Stomach cancer Neg Hx     Social History Reviewed with no changes to be made today.   Outpatient Medications Prior to Visit  Medication Sig Dispense Refill   methocarbamol (ROBAXIN) 500 MG tablet Take 2 tablets (1,000 mg total) by mouth every 8 (eight) hours as needed for muscle spasms. 90 tablet 0   naproxen (NAPROSYN) 500 MG tablet Take 1 tablet (500 mg total) by mouth 2 (two) times daily with a meal. 30 tablet 0   blood glucose meter kit and supplies KIT Dispense based on patient and insurance preference. Use up to four times daily as directed. (FOR ICD-9 250.00, 250.01). 1 each 0   busPIRone (BUSPAR) 15 MG tablet Take 1 tablet (15 mg total) by mouth 3 (three) times daily. (Patient not taking: Reported on 02/16/2021) 90 tablet 3   omeprazole (PRILOSEC) 40 MG capsule Take 1 capsule (40 mg total) by mouth 2 (two) times daily. (Patient not taking: Reported on 09/28/2020) 60 capsule 3   albuterol (VENTOLIN HFA) 108 (90 Base) MCG/ACT inhaler INHALE 2 PUFFS INTO THE LUNGS EVERY 6 (SIX) HOURS AS NEEDED FOR WHEEZING OR SHORTNESS OF BREATH. 18 g 5   atorvastatin (LIPITOR) 20 MG tablet TAKE 1 TABLET (20 MG TOTAL) BY MOUTH DAILY. 30 tablet 0   Blood Glucose Monitoring Suppl (TRUE METRIX METER) w/Device KIT Use as directed to check blood sugar daily. 1 kit 0   citalopram (CELEXA) 40 MG tablet TAKE 1 TABLET (40 MG TOTAL) BY MOUTH DAILY. (Patient  not taking:  Reported on 02/16/2021) 30 tablet 3   Dulaglutide 0.75 MG/0.5ML SOPN Inject 0.75 mg into the skin every Monday. 3 mL 0   gabapentin (NEURONTIN) 300 MG capsule Take 1 capsule (300 mg total) by mouth 3 (three) times daily as needed (pain). 90 capsule 0   glucose blood (TRUE METRIX BLOOD GLUCOSE TEST) test strip Use as instructed to check blood sugar daily. 100 each 5   insulin glargine (LANTUS) 100 UNIT/ML Solostar Pen Inject 35 Units into the skin daily. 15 mL 1   Lactulose 20 GM/30ML SOLN Take 30 ml at bedtime and up to twice daily as needed (Patient not taking: Reported on 02/16/2021) 450 mL 4   lidocaine (LIDODERM) 5 % PLACE 1 PATCH ONTO THE SKIN DAILY. REMOVE & DISCARD PATCH WITHIN 12 HOURS OR AS DIRECTED BY MD (Patient not taking: Reported on 02/16/2021) 15 patch 1   metFORMIN (GLUCOPHAGE) 1000 MG tablet Take 1 tablet (1,000 mg total) by mouth 2 (two) times daily with a meal. 60 tablet 0   oxyCODONE-acetaminophen (PERCOCET) 10-325 MG per tablet Take 1 tablet by mouth every 4 (four) hours as needed for pain.     TRUEplus Lancets 28G MISC Use as instructed to test blood sugar daily. 100 each 6   No facility-administered medications prior to visit.    Allergies  Allergen Reactions   Penicillins Swelling       Objective:    BP (!) 156/109 (BP Location: Left Arm, Patient Position: Sitting, Cuff Size: Large)   Pulse 73   Resp 16   Ht 6' (1.829 m)   Wt 292 lb 3.2 oz (132.5 kg)   SpO2 95%   BMI 39.63 kg/m  Wt Readings from Last 3 Encounters:  02/16/21 292 lb 3.2 oz (132.5 kg)  04/15/20 290 lb 9.6 oz (131.8 kg)  01/04/20 276 lb (125.2 kg)    Physical Exam Vitals and nursing note reviewed.  Constitutional:      Appearance: He is well-developed.  HENT:     Head: Normocephalic and atraumatic.  Cardiovascular:     Rate and Rhythm: Normal rate and regular rhythm.     Heart sounds: Normal heart sounds. No murmur heard.   No friction rub. No gallop.  Pulmonary:     Effort: Pulmonary  effort is normal. No tachypnea or respiratory distress.     Breath sounds: Normal breath sounds. No decreased breath sounds, wheezing, rhonchi or rales.  Chest:     Chest wall: No tenderness.  Abdominal:     General: Bowel sounds are normal.     Palpations: Abdomen is soft.  Musculoskeletal:        General: Normal range of motion.     Right hand: Deformity present.     Left hand: Deformity present.     Cervical back: Normal range of motion.  Skin:    General: Skin is warm and dry.  Neurological:     Mental Status: He is alert and oriented to person, place, and time.     Coordination: Coordination normal.  Psychiatric:        Behavior: Behavior normal. Behavior is cooperative.        Thought Content: Thought content normal.        Judgment: Judgment normal.         Patient has been counseled extensively about nutrition and exercise as well as the importance of adherence with medications and regular follow-up. The patient was given clear instructions to go to ER  or return to medical center if symptoms don't improve, worsen or new problems develop. The patient verbalized understanding.   Follow-up: Return for BP CHECK WITH LUKE 3 weeks then see me in 3 months.   Gildardo Pounds, FNP-BC [facility] [place], [place]   02/19/2021, 11:25 PM

## 2021-02-17 ENCOUNTER — Other Ambulatory Visit: Payer: Self-pay

## 2021-02-17 MED ORDER — TRULICITY 1.5 MG/0.5ML ~~LOC~~ SOAJ
1.5000 mg | SUBCUTANEOUS | 1 refills | Status: AC
  Filled 2021-02-17: qty 6, 84d supply, fill #0
  Filled 2021-02-17: qty 2, 28d supply, fill #0

## 2021-02-17 MED ORDER — SULFAMETHOXAZOLE-TRIMETHOPRIM 800-160 MG PO TABS
1.0000 | ORAL_TABLET | Freq: Two times a day (BID) | ORAL | 0 refills | Status: DC
  Filled 2021-02-17: qty 10, 5d supply, fill #0

## 2021-02-18 LAB — CBC WITH DIFFERENTIAL/PLATELET
Basophils Absolute: 0.1 10*3/uL (ref 0.0–0.2)
Basos: 1 %
EOS (ABSOLUTE): 0.3 10*3/uL (ref 0.0–0.4)
Eos: 3 %
Hematocrit: 44.4 % (ref 37.5–51.0)
Hemoglobin: 14.8 g/dL (ref 13.0–17.7)
Immature Grans (Abs): 0 10*3/uL (ref 0.0–0.1)
Immature Granulocytes: 0 %
Lymphocytes Absolute: 2.4 10*3/uL (ref 0.7–3.1)
Lymphs: 29 %
MCH: 29.7 pg (ref 26.6–33.0)
MCHC: 33.3 g/dL (ref 31.5–35.7)
MCV: 89 fL (ref 79–97)
Monocytes Absolute: 0.5 10*3/uL (ref 0.1–0.9)
Monocytes: 6 %
Neutrophils Absolute: 5 10*3/uL (ref 1.4–7.0)
Neutrophils: 61 %
Platelets: 222 10*3/uL (ref 150–450)
RBC: 4.98 x10E6/uL (ref 4.14–5.80)
RDW: 11.9 % (ref 11.6–15.4)
WBC: 8.2 10*3/uL (ref 3.4–10.8)

## 2021-02-18 LAB — CMP14+EGFR
ALT: 21 IU/L (ref 0–44)
AST: 16 IU/L (ref 0–40)
Albumin/Globulin Ratio: 1.4 (ref 1.2–2.2)
Albumin: 4.3 g/dL (ref 4.0–5.0)
Alkaline Phosphatase: 108 IU/L (ref 44–121)
BUN/Creatinine Ratio: 12 (ref 9–20)
BUN: 11 mg/dL (ref 6–24)
Bilirubin Total: 0.3 mg/dL (ref 0.0–1.2)
CO2: 29 mmol/L (ref 20–29)
Calcium: 9.9 mg/dL (ref 8.7–10.2)
Chloride: 92 mmol/L — ABNORMAL LOW (ref 96–106)
Creatinine, Ser: 0.89 mg/dL (ref 0.76–1.27)
Globulin, Total: 3.1 g/dL (ref 1.5–4.5)
Glucose: 400 mg/dL — ABNORMAL HIGH (ref 65–99)
Potassium: 4.5 mmol/L (ref 3.5–5.2)
Sodium: 134 mmol/L (ref 134–144)
Total Protein: 7.4 g/dL (ref 6.0–8.5)
eGFR: 106 mL/min/{1.73_m2} (ref >59)

## 2021-02-18 LAB — URINE CYTOLOGY ANCILLARY ONLY
Bacterial Vaginitis-Urine: NEGATIVE
Candida Urine: POSITIVE — AB
Chlamydia: NEGATIVE
Comment: NEGATIVE
Comment: NEGATIVE
Comment: NORMAL
Neisseria Gonorrhea: NEGATIVE
Trichomonas: NEGATIVE

## 2021-02-18 LAB — MICROALBUMIN / CREATININE URINE RATIO
Creatinine, Urine: 44.4 mg/dL
Microalb/Creat Ratio: 153 mg/g creat — ABNORMAL HIGH (ref 0–29)
Microalbumin, Urine: 67.9 ug/mL

## 2021-02-18 LAB — HIV ANTIBODY (ROUTINE TESTING W REFLEX): HIV Screen 4th Generation wRfx: NONREACTIVE

## 2021-02-18 LAB — RHEUMATOID ARTHRITIS PROFILE
Cyclic Citrullin Peptide Ab: 9 units (ref 0–19)
Rheumatoid fact SerPl-aCnc: <10 IU/mL (ref <14.0)

## 2021-02-19 ENCOUNTER — Encounter: Payer: Self-pay | Admitting: Nurse Practitioner

## 2021-02-19 ENCOUNTER — Other Ambulatory Visit: Payer: Self-pay | Admitting: Nurse Practitioner

## 2021-02-19 MED ORDER — FLUCONAZOLE 150 MG PO TABS: 150.0000 mg | ORAL_TABLET | Freq: Once | ORAL | 0 refills | Status: AC

## 2021-03-11 ENCOUNTER — Ambulatory Visit: Payer: Medicaid Other | Admitting: Pharmacist

## 2021-03-20 ENCOUNTER — Other Ambulatory Visit: Payer: Self-pay

## 2021-04-28 ENCOUNTER — Other Ambulatory Visit: Payer: Self-pay

## 2021-05-20 ENCOUNTER — Other Ambulatory Visit: Payer: Self-pay

## 2021-05-20 ENCOUNTER — Ambulatory Visit: Payer: Self-pay | Attending: Nurse Practitioner | Admitting: Nurse Practitioner

## 2021-05-20 ENCOUNTER — Encounter: Payer: Self-pay | Admitting: Nurse Practitioner

## 2021-05-20 VITALS — BP 141/95 | HR 79 | Resp 16 | Wt 283.8 lb

## 2021-05-20 DIAGNOSIS — E785 Hyperlipidemia, unspecified: Secondary | ICD-10-CM

## 2021-05-20 DIAGNOSIS — E1169 Type 2 diabetes mellitus with other specified complication: Secondary | ICD-10-CM

## 2021-05-20 DIAGNOSIS — M545 Low back pain, unspecified: Secondary | ICD-10-CM

## 2021-05-20 DIAGNOSIS — F32A Depression, unspecified: Secondary | ICD-10-CM

## 2021-05-20 DIAGNOSIS — J449 Chronic obstructive pulmonary disease, unspecified: Secondary | ICD-10-CM

## 2021-05-20 DIAGNOSIS — E118 Type 2 diabetes mellitus with unspecified complications: Secondary | ICD-10-CM

## 2021-05-20 DIAGNOSIS — G8929 Other chronic pain: Secondary | ICD-10-CM

## 2021-05-20 DIAGNOSIS — I10 Essential (primary) hypertension: Secondary | ICD-10-CM

## 2021-05-20 DIAGNOSIS — F419 Anxiety disorder, unspecified: Secondary | ICD-10-CM

## 2021-05-20 DIAGNOSIS — Z1159 Encounter for screening for other viral diseases: Secondary | ICD-10-CM

## 2021-05-20 LAB — GLUCOSE, POCT (MANUAL RESULT ENTRY): POC Glucose: 255 mg/dl — AB (ref 70–99)

## 2021-05-20 LAB — POCT GLYCOSYLATED HEMOGLOBIN (HGB A1C): HbA1c, POC (controlled diabetic range): 9.4 % — AB (ref 0.0–7.0)

## 2021-05-20 MED ORDER — INSULIN GLARGINE 100 UNIT/ML SOLOSTAR PEN
40.0000 [IU] | PEN_INJECTOR | Freq: Every day | SUBCUTANEOUS | 1 refills | Status: AC
  Filled 2021-05-20 – 2021-06-17 (×3): qty 12, 30d supply, fill #0
  Filled 2021-07-24: qty 12, 30d supply, fill #1
  Filled 2021-12-22: qty 30, 75d supply, fill #0
  Filled 2021-12-22: qty 12, 30d supply, fill #0

## 2021-05-20 MED ORDER — TRULICITY 3 MG/0.5ML ~~LOC~~ SOAJ
3.0000 mg | SUBCUTANEOUS | 3 refills | Status: AC
  Filled 2021-05-20 – 2021-12-22 (×5): qty 2, 28d supply, fill #0

## 2021-05-20 MED ORDER — ATORVASTATIN CALCIUM 20 MG PO TABS
20.0000 mg | ORAL_TABLET | Freq: Every day | ORAL | 3 refills | Status: DC
  Filled 2021-05-20 – 2021-06-17 (×3): qty 30, 30d supply, fill #0
  Filled 2021-07-24: qty 30, 30d supply, fill #1
  Filled 2021-12-22: qty 30, 30d supply, fill #0

## 2021-05-20 MED ORDER — ALBUTEROL SULFATE HFA 108 (90 BASE) MCG/ACT IN AERS
2.0000 | INHALATION_SPRAY | Freq: Four times a day (QID) | RESPIRATORY_TRACT | 1 refills | Status: DC | PRN
  Filled 2021-05-20 – 2021-06-17 (×3): qty 18, 25d supply, fill #0
  Filled 2021-07-22: qty 18, 25d supply, fill #1

## 2021-05-20 MED ORDER — DAPAGLIFLOZIN PROPANEDIOL 10 MG PO TABS
10.0000 mg | ORAL_TABLET | Freq: Every day | ORAL | 1 refills | Status: AC
  Filled 2021-05-20 – 2021-06-17 (×3): qty 30, 30d supply, fill #0
  Filled 2021-07-24: qty 30, 30d supply, fill #1
  Filled 2021-12-22: qty 30, 30d supply, fill #0

## 2021-05-20 MED ORDER — CITALOPRAM HYDROBROMIDE 40 MG PO TABS
40.0000 mg | ORAL_TABLET | Freq: Every day | ORAL | 1 refills | Status: DC
  Filled 2021-05-20 – 2021-06-17 (×3): qty 30, 30d supply, fill #0
  Filled 2021-07-24: qty 30, 30d supply, fill #1

## 2021-05-20 MED ORDER — BUSPIRONE HCL 15 MG PO TABS
15.0000 mg | ORAL_TABLET | Freq: Three times a day (TID) | ORAL | 3 refills | Status: DC
  Filled 2021-05-20 – 2021-06-17 (×4): qty 90, 30d supply, fill #0

## 2021-05-20 MED ORDER — GABAPENTIN 300 MG PO CAPS
300.0000 mg | ORAL_CAPSULE | Freq: Three times a day (TID) | ORAL | 3 refills | Status: DC | PRN
  Filled 2021-05-20 – 2021-06-17 (×3): qty 90, 30d supply, fill #0
  Filled 2021-07-24: qty 90, 30d supply, fill #1
  Filled 2021-12-22: qty 90, 30d supply, fill #0
  Filled 2022-01-22: qty 90, 30d supply, fill #1

## 2021-05-20 MED ORDER — LISINOPRIL 40 MG PO TABS
40.0000 mg | ORAL_TABLET | Freq: Every day | ORAL | 3 refills | Status: DC
  Filled 2021-05-20 – 2021-06-17 (×3): qty 30, 30d supply, fill #0
  Filled 2021-07-24: qty 30, 30d supply, fill #1

## 2021-05-20 NOTE — Progress Notes (Signed)
Assessment & Plan:  Mark Ferrell was seen today for diabetes.  Diagnoses and all orders for this visit:  DM (diabetes mellitus), type 2 with complications ([place]) -     POCT glucose (manual entry) -     POCT glycosylated hemoglobin (Hb A1C) -     dapagliflozin propanediol (FARXIGA) 10 MG TABS tablet; Take 1 tablet (10 mg total) by mouth daily before breakfast. NEEDS PASS -     insulin glargine (LANTUS) 100 UNIT/ML Solostar Pen; Inject 40 Units into the skin daily. -     Dulaglutide (TRULICITY) 3 DE/0.8XK SOPN; Inject 3 mg as directed once a week. NEEDS PASS -     CMP14+EGFR  Hyperlipidemia associated with type 2 diabetes mellitus (HCC) -     atorvastatin (LIPITOR) 20 MG tablet; Take 1 tablet (20 mg total) by mouth daily. NEED PASS PROGRAM  Anxiety and depression -     citalopram (CELEXA) 40 MG tablet; Take 1 tablet (40 mg total) by mouth daily. NEEDS PASS -     busPIRone (BUSPAR) 15 MG tablet; Take 1 tablet (15 mg total) by mouth 3 (three) times daily. NEEDS PASS  Primary hypertension -     lisinopril (ZESTRIL) 40 MG tablet; Take 1 tablet (40 mg total) by mouth daily. NEEDS PASS  Chronic low back pain without sciatica, unspecified back pain laterality -     gabapentin (NEURONTIN) 300 MG capsule; Take 1 capsule (300 mg total) by mouth 3 (three) times daily as needed (pain).  Chronic obstructive pulmonary disease, unspecified COPD type (HCC) -     albuterol (VENTOLIN HFA) 108 (90 Base) MCG/ACT inhaler; INHALE 2 PUFFS INTO THE LUNGS EVERY 6 (SIX) HOURS AS NEEDED FOR WHEEZING OR SHORTNESS OF BREATH.  Need for hepatitis C screening test -     HCV Ab w Reflex to Quant PCR   Patient has been counseled on age-appropriate routine health concerns for screening and prevention. These are reviewed and up-to-date. Referrals have been placed accordingly. Immunizations are up-to-date or declined.    Subjective:   Chief Complaint  Patient presents with   Diabetes   HPI Mark Ferrell 50 y.o. male  presents to office today for follow up to DM and HTN. He has a past medical history of Anxiety, Arthritis, Colon polyps (01/04/2020), Depression, DM2 (03/28/2019), Diverticulosis, GERD, Hyperlipidemia, and Hypertension.    DM 2  Poorly controlled likely related to dietary nonadherence. Will increase trulicity from 1.5 to 3 mg and farxiga to 10 mg from 5 mg. He will continue on lantus 40 units daily. LDL at goal with atorvastatin 20 mg daily.  Lab Results  Component Value Date   HGBA1C 9.4 (A) 05/20/2021    Lab Results  Component Value Date   HGBA1C 10.8 (A) 02/16/2021   Lab Results  Component Value Date   LDLCALC 58 07/02/2019     HTN Poorly controlled. Will increase lisinopril from 39m to 40 mg daily. He does not monitor his blood pressure at home.  BP Readings from Last 3 Encounters:  05/20/21 (!) 141/95  02/16/21 (!) 156/109  09/28/20 121/64    Anxiety and Depression He endorses adherence with celexa but can not recall if he has been taking buspar. States he does not know what a lot of his medications are for but his anxiety seems to be worsening  Chronic pain Generalized. He is requesting tramadol today. However I suggested he try gabapentin 300 mg TID instead.   Review of Systems  Constitutional:  Negative  for fever, malaise/fatigue and weight loss.  HENT: Negative.  Negative for nosebleeds.   Eyes: Negative.  Negative for blurred vision, double vision and photophobia.  Respiratory: Negative.  Negative for cough and shortness of breath.   Cardiovascular: Negative.  Negative for chest pain, palpitations and leg swelling.  Gastrointestinal: Negative.  Negative for heartburn, nausea and vomiting.  Musculoskeletal:  Positive for back pain, joint pain and myalgias.  Neurological: Negative.  Negative for dizziness, focal weakness, seizures and headaches.  Psychiatric/Behavioral:  Positive for depression. Negative for suicidal ideas. The patient is nervous/anxious.    Past  Medical History:  Diagnosis Date   Anxiety    Arthritis    Colon polyps 01/04/2020   Removal of multiple colon polyps during colonoscopy 12/2019- Dr. Thornton Park   Depression    Diabetes mellitus without complication ([place]) 15/11/6977   Diverticulosis    GERD (gastroesophageal reflux disease)    Hyperlipidemia    Hypertension     Past Surgical History:  Procedure Laterality Date   BONY PELVIS SURGERY     HUMERUS FRACTURE SURGERY Right 2009    Family History  Problem Relation Age of Onset   Hypertension Mother    Diabetes Mother    Lung cancer Father    Cancer Brother        in his sinus's area and head   Diabetes Paternal Grandfather    Colon polyps Neg Hx    Esophageal cancer Neg Hx    Rectal cancer Neg Hx    Stomach cancer Neg Hx     Social History Reviewed with no changes to be made today.   Outpatient Medications Prior to Visit  Medication Sig Dispense Refill   blood glucose meter kit and supplies KIT Dispense based on patient and insurance preference. Use up to four times daily as directed. (FOR ICD-9 250.00, 250.01). 1 each 0   Blood Glucose Monitoring Suppl (TRUE METRIX METER) w/Device KIT Use as directed to check blood sugar daily. 1 kit 0   clotrimazole-betamethasone (LOTRISONE) cream Apply 1 application topically daily. NEEDS PASS 30 g 0   glucose blood (TRUE METRIX BLOOD GLUCOSE TEST) test strip Use as instructed to check blood sugar daily. 100 each 5   methocarbamol (ROBAXIN) 500 MG tablet Take 2 tablets (1,000 mg total) by mouth every 8 (eight) hours as needed for muscle spasms. 90 tablet 0   naproxen (NAPROSYN) 500 MG tablet Take 1 tablet (500 mg total) by mouth 2 (two) times daily with a meal. 30 tablet 0   TRUEplus Lancets 28G MISC Use as instructed to test blood sugar daily. 100 each 6   albuterol (VENTOLIN HFA) 108 (90 Base) MCG/ACT inhaler INHALE 2 PUFFS INTO THE LUNGS EVERY 6 (SIX) HOURS AS NEEDED FOR WHEEZING OR SHORTNESS OF BREATH. 18 g 1    atorvastatin (LIPITOR) 20 MG tablet Take 1 tablet (20 mg total) by mouth daily. 90 tablet 3   busPIRone (BUSPAR) 15 MG tablet Take 1 tablet (15 mg total) by mouth 3 (three) times daily. (Patient not taking: Reported on 02/16/2021) 90 tablet 3   citalopram (CELEXA) 40 MG tablet Take 1 tablet (40 mg total) by mouth daily. 90 tablet 1   dapagliflozin propanediol (FARXIGA) 5 MG TABS tablet Take 1 tablet (5 mg total) by mouth daily before breakfast. NEEDS PASS 30 tablet 3   gabapentin (NEURONTIN) 300 MG capsule Take 1 capsule (300 mg total) by mouth 3 (three) times daily as needed (pain). 270 capsule 3  insulin glargine (LANTUS) 100 UNIT/ML Solostar Pen Inject 40 Units into the skin daily. 36 mL 1   lisinopril (ZESTRIL) 20 MG tablet Take 1 tablet (20 mg total) by mouth daily. 90 tablet 3   No facility-administered medications prior to visit.    Allergies  Allergen Reactions   Penicillins Swelling       Objective:    BP (!) 141/95   Pulse 79   Resp 16   Wt 283 lb 12.8 oz (128.7 kg)   SpO2 93%   BMI 38.49 kg/m  Wt Readings from Last 3 Encounters:  05/20/21 283 lb 12.8 oz (128.7 kg)  02/16/21 292 lb 3.2 oz (132.5 kg)  04/15/20 290 lb 9.6 oz (131.8 kg)    Physical Exam Vitals and nursing note reviewed.  Constitutional:      Appearance: He is well-developed.  HENT:     Head: Normocephalic and atraumatic.  Cardiovascular:     Rate and Rhythm: Normal rate and regular rhythm.     Heart sounds: Normal heart sounds. No murmur heard.   No friction rub. No gallop.  Pulmonary:     Effort: Pulmonary effort is normal. No tachypnea or respiratory distress.     Breath sounds: Examination of the right-middle field reveals wheezing. Examination of the left-middle field reveals wheezing. Examination of the right-lower field reveals wheezing. Examination of the left-lower field reveals wheezing. Wheezing present. No decreased breath sounds, rhonchi or rales.  Chest:     Chest wall: No tenderness.   Abdominal:     General: Bowel sounds are normal.     Palpations: Abdomen is soft.  Musculoskeletal:        General: Normal range of motion.     Cervical back: Normal range of motion.  Skin:    General: Skin is warm and dry.  Neurological:     Mental Status: He is alert and oriented to person, place, and time.     Coordination: Coordination normal.  Psychiatric:        Attention and Perception: Attention normal.        Mood and Affect: Mood is anxious.        Behavior: Behavior normal. Behavior is cooperative.        Thought Content: Thought content normal.        Judgment: Judgment normal.         Patient has been counseled extensively about nutrition and exercise as well as the importance of adherence with medications and regular follow-up. The patient was given clear instructions to go to ER or return to medical center if symptoms don't improve, worsen or new problems develop. The patient verbalized understanding.   Follow-up: Return for 4 weeks with  Lurena Joiner for meter check and labs. See me in 3 months.   Gildardo Pounds, FNP-BC [facility] [place], [place]   05/20/2021, 10:42 PM

## 2021-05-21 ENCOUNTER — Other Ambulatory Visit: Payer: Self-pay

## 2021-05-28 ENCOUNTER — Other Ambulatory Visit: Payer: Self-pay

## 2021-05-29 ENCOUNTER — Other Ambulatory Visit: Payer: Self-pay

## 2021-06-01 ENCOUNTER — Other Ambulatory Visit: Payer: Self-pay

## 2021-06-05 ENCOUNTER — Other Ambulatory Visit: Payer: Self-pay

## 2021-06-08 ENCOUNTER — Other Ambulatory Visit: Payer: Self-pay

## 2021-06-17 ENCOUNTER — Ambulatory Visit: Payer: Medicaid Other | Attending: Nurse Practitioner | Admitting: Pharmacist

## 2021-06-17 ENCOUNTER — Other Ambulatory Visit: Payer: Self-pay

## 2021-06-17 DIAGNOSIS — E118 Type 2 diabetes mellitus with unspecified complications: Secondary | ICD-10-CM

## 2021-06-17 NOTE — Progress Notes (Signed)
    S:    Pcp: Mark Ferrell   Patient arrives in good spirits.  Presents for diabetes evaluation, education, and management. Patient was referred and last seen by Primary Care Provider Mark Ferrell on 68/05/5725 where Trulicity & lisinopril were increased and Farxiga was decreased.   Patient reports Diabetes was diagnosed in 2021.   Family/Social History:  -Former smoker -Mother: HTN, DM -Father: lung cancer  Insurance coverage/medication affordability: [place] Medicaid  Medication adherence: Reports being out of Trulicity for a month. Wilder Glade has been out for 2.5 weeks. Lantus has not been given in the last week.  Current diabetes medications include:  -Farxiga (still charted as 10 mg) -Trulicity 3 mg weekly -Lantus 40 units daily  Current hypertension medications include:  -Lisinopril 40 mg QD  Current hyperlipidemia medications include:  -Atorvastatin 20 mg QD  Patient denies hypoglycemic events.  Patient reported dietary habits: Eats 1-3 meals/day Chicken, pork chops, hamburgers, some green beans, pineapple, broccoli, beans,reports some bread and tries to incorporate wheat bread.  Patient-reported exercise habits: walks at work frequently.   Patient reports nocturia (nighttime urination).  Patient denies neuropathy (nerve pain). Patient reports occasional visual changes. Patient reports self foot exams.    O:  Lab Results  Component Value Date   HGBA1C 9.4 (A) 05/20/2021   There were no vitals filed for this visit.  Lipid Panel     Component Value Date/Time   CHOL 112 07/02/2019 0931   TRIG 72 07/02/2019 0931   HDL 39 (L) 07/02/2019 0931   CHOLHDL 2.9 07/02/2019 0931   LDLCALC 58 07/02/2019 0931   Home fasting blood sugars: none  2 hour post-meal/random blood sugars: none.  Clinical Atherosclerotic Cardiovascular Disease (ASCVD): No  The ASCVD Risk score (Arnett DK, et al., 2019) failed to calculate for the following reasons:   The valid total cholesterol range  is 130 to 320 mg/dL   A/P: Diabetes longstanding, currently uncontrolled due to nonadherence. Patient is able to verbalize appropriate hypoglycemia management plan. Medication adherence appears poor due to recently heavy personal life. Control is suboptimal due to adherence. -Restart medications as prescribed, as efficacy of regimen is unknown due to nonadherence -Continue Lantus 40 units QD -Continue Trulicity 3 mg weekly -Continue Farxiga 10 mg QD -CMP14+eGFR, Lipid -Extensively discussed pathophysiology of diabetes, recommended lifestyle interventions, dietary effects on blood sugar control -Counseled on s/sx of and management of hypoglycemia -Next A1C anticipated 08/20/2020.   ASCVD risk - primary prevention in patient with diabetes. Last LDL is controlled. ASCVD risk score  unknown   -Continue Atorvastatin 20 mg daily -Check lipid panel today  Written patient instructions provided.  Total time in face to face counseling 30 minutes. Follow up Pharmacist at [facility] in 3 weeks.  Thank you for allowing pharmacy to participate in this patient's care.  Reatha Harps, PharmD PGY1 Pharmacy Resident 06/17/2021 4:37 PM

## 2021-06-18 LAB — CMP14+EGFR
ALT: 27 IU/L (ref 0–44)
AST: 21 IU/L (ref 0–40)
Albumin/Globulin Ratio: 1.5 (ref 1.2–2.2)
Albumin: 4.5 g/dL (ref 4.0–5.0)
Alkaline Phosphatase: 90 IU/L (ref 44–121)
BUN/Creatinine Ratio: 17 (ref 9–20)
BUN: 17 mg/dL (ref 6–24)
Bilirubin Total: 0.6 mg/dL (ref 0.0–1.2)
CO2: 27 mmol/L (ref 20–29)
Calcium: 9.4 mg/dL (ref 8.7–10.2)
Chloride: 94 mmol/L — ABNORMAL LOW (ref 96–106)
Creatinine, Ser: 1 mg/dL (ref 0.76–1.27)
Globulin, Total: 3.1 g/dL (ref 1.5–4.5)
Glucose: 378 mg/dL — ABNORMAL HIGH (ref 70–99)
Potassium: 4.6 mmol/L (ref 3.5–5.2)
Sodium: 135 mmol/L (ref 134–144)
Total Protein: 7.6 g/dL (ref 6.0–8.5)
eGFR: 93 mL/min/{1.73_m2} (ref >59)

## 2021-06-18 LAB — LIPID PANEL
Chol/HDL Ratio: 5.4 ratio — ABNORMAL HIGH (ref 0.0–5.0)
Cholesterol, Total: 206 mg/dL — ABNORMAL HIGH (ref 100–199)
HDL: 38 mg/dL — ABNORMAL LOW (ref >39)
LDL Chol Calc (NIH): 107 mg/dL — ABNORMAL HIGH (ref 0–99)
Triglycerides: 358 mg/dL — ABNORMAL HIGH (ref 0–149)
VLDL Cholesterol Cal: 61 mg/dL — ABNORMAL HIGH (ref 5–40)

## 2021-06-22 ENCOUNTER — Other Ambulatory Visit: Payer: Self-pay | Admitting: Nurse Practitioner

## 2021-06-22 ENCOUNTER — Other Ambulatory Visit: Payer: Self-pay

## 2021-06-26 ENCOUNTER — Other Ambulatory Visit: Payer: Self-pay

## 2021-06-30 ENCOUNTER — Other Ambulatory Visit: Payer: Self-pay

## 2021-07-07 ENCOUNTER — Other Ambulatory Visit: Payer: Self-pay

## 2021-07-15 ENCOUNTER — Ambulatory Visit: Payer: Medicaid Other | Admitting: Pharmacist

## 2021-07-15 NOTE — Progress Notes (Unsigned)
° ° °  S:    Pcp: Mark Ferrell   Patient arrives in good spirits.  Presents for diabetes evaluation, education, and management. Patient was referred and last seen by Primary Care Provider Geryl Rankins on 46/96/2952 where Trulicity & lisinopril were increased and Farxiga was decreased. Last seen by pharmacy clinic 06/17/21, patient reported nonadherence with diabetes medications and current medications were resumed.   Today, *** -lipids elevated, no changes made -  Patient reports Diabetes was diagnosed in 2021.   Family/Social History:  -Former smoker -Mother: HTN, DM -Father: lung cancer  Insurance coverage/medication affordability: [place] Medicaid  Medication adherence: Reports being out of Trulicity for a month. Wilder Glade has been out for 2.5 weeks. Lantus has not been given in the last week.  Current diabetes medications include:  -Farxiga (still charted as 10 mg) -Trulicity 3 mg weekly -Lantus 40 units daily  Current hypertension medications include:  -Lisinopril 40 mg daily  Current hyperlipidemia medications include:  -Atorvastatin 20 mg daily  Patient denies hypoglycemic events.  Patient reported dietary habits: Eats 1-3 meals/day Chicken, pork chops, hamburgers, some green beans, pineapple, broccoli, beans,reports some bread and tries to incorporate wheat bread.  Patient-reported exercise habits: walks at work frequently.   Patient reports nocturia (nighttime urination).  Patient denies neuropathy (nerve pain). Patient reports occasional visual changes. Patient reports self foot exams.    O:  Lab Results  Component Value Date   HGBA1C 9.4 (A) 05/20/2021   There were no vitals filed for this visit.  Lipid Panel     Component Value Date/Time   CHOL 206 (H) 06/17/2021 1640   TRIG 358 (H) 06/17/2021 1640   HDL 38 (L) 06/17/2021 1640   CHOLHDL 5.4 (H) 06/17/2021 1640   LDLCALC 107 (H) 06/17/2021 1640   Home fasting blood sugars: none  2 hour post-meal/random blood  sugars: none.  Clinical Atherosclerotic Cardiovascular Disease (ASCVD): No  The 10-year ASCVD risk score (Arnett DK, et al., 2019) is: 10.4%   Values used to calculate the score:     Age: 50 years     Sex: Male     Is Non-Hispanic African American: No     Diabetic: Yes     Tobacco smoker: No     Systolic Blood Pressure: 841 mmHg     Is BP treated: Yes     HDL Cholesterol: 38 mg/dL     Total Cholesterol: 206 mg/dL   A/P: Diabetes longstanding, currently uncontrolled due to nonadherence. Patient is able to verbalize appropriate hypoglycemia management plan. Medication adherence appears poor due to recently heavy personal life. Control is suboptimal due to adherence. -*** -Extensively discussed pathophysiology of diabetes, recommended lifestyle interventions, dietary effects on blood sugar control -Counseled on s/sx of and management of hypoglycemia -Next A1C anticipated 08/20/2020.   ASCVD risk - primary prevention in patient with diabetes. Last LDL is controlled. ASCVD risk score  unknown   -Continue Atorvastatin 20 mg daily -Check lipid panel today  Total time in face to face counseling 30 minutes. Follow up Pharmacist at [facility] in 3 weeks.

## 2021-07-17 ENCOUNTER — Other Ambulatory Visit: Payer: Self-pay

## 2021-07-22 ENCOUNTER — Other Ambulatory Visit: Payer: Self-pay

## 2021-07-24 ENCOUNTER — Other Ambulatory Visit: Payer: Self-pay

## 2021-07-24 ENCOUNTER — Other Ambulatory Visit: Payer: Self-pay | Admitting: Nurse Practitioner

## 2021-07-24 DIAGNOSIS — J449 Chronic obstructive pulmonary disease, unspecified: Secondary | ICD-10-CM

## 2021-07-24 MED ORDER — ALBUTEROL SULFATE HFA 108 (90 BASE) MCG/ACT IN AERS
2.0000 | INHALATION_SPRAY | Freq: Four times a day (QID) | RESPIRATORY_TRACT | 1 refills | Status: DC | PRN
  Filled 2021-07-24 – 2021-12-22 (×2): qty 18, 25d supply, fill #0

## 2021-07-24 NOTE — Telephone Encounter (Signed)
Requested Prescriptions  Pending Prescriptions Disp Refills   albuterol (VENTOLIN HFA) 108 (90 Base) MCG/ACT inhaler 18 g 1    Sig: INHALE 2 PUFFS INTO THE LUNGS EVERY 6 (SIX) HOURS AS NEEDED FOR WHEEZING OR SHORTNESS OF BREATH.     Pulmonology:  Beta Agonists Failed - 07/24/2021 12:07 PM      Failed - One inhaler should last at least one month. If the patient is requesting refills earlier, contact the patient to check for uncontrolled symptoms.      Passed - Valid encounter within last 12 months    Recent Outpatient Visits          1 month ago DM (diabetes mellitus), type 2 with complications [facility])   [place], Jarome Matin, RPH-CPP   2 months ago DM (diabetes mellitus), type 2 with complications [facility])   [place], [place] W, NP   5 months ago DM (diabetes mellitus), type 2 with complications [facility])   [place], Zelda W, NP   7 months ago Acute pain of right shoulder   [place], [place]   1 year ago Chronic pain syndrome   [place], Deborah B, MD      Future Appointments            In 4 weeks Gildardo Pounds, NP [place]

## 2021-07-27 ENCOUNTER — Other Ambulatory Visit: Payer: Self-pay

## 2021-07-29 ENCOUNTER — Other Ambulatory Visit: Payer: Self-pay

## 2021-08-05 ENCOUNTER — Ambulatory Visit: Payer: Medicaid Other | Admitting: Pharmacist

## 2021-08-21 ENCOUNTER — Other Ambulatory Visit: Payer: Self-pay

## 2021-08-21 ENCOUNTER — Ambulatory Visit: Payer: Medicaid Other | Attending: Nurse Practitioner | Admitting: Nurse Practitioner

## 2021-08-21 ENCOUNTER — Encounter: Payer: Self-pay | Admitting: Nurse Practitioner

## 2021-08-21 DIAGNOSIS — Z1211 Encounter for screening for malignant neoplasm of colon: Secondary | ICD-10-CM

## 2021-08-21 DIAGNOSIS — R6882 Decreased libido: Secondary | ICD-10-CM

## 2021-08-21 DIAGNOSIS — E1165 Type 2 diabetes mellitus with hyperglycemia: Secondary | ICD-10-CM

## 2021-08-21 DIAGNOSIS — R5382 Chronic fatigue, unspecified: Secondary | ICD-10-CM

## 2021-08-21 DIAGNOSIS — E785 Hyperlipidemia, unspecified: Secondary | ICD-10-CM

## 2021-08-21 DIAGNOSIS — Z794 Long term (current) use of insulin: Secondary | ICD-10-CM

## 2021-08-21 DIAGNOSIS — Z1159 Encounter for screening for other viral diseases: Secondary | ICD-10-CM

## 2021-08-21 NOTE — Progress Notes (Signed)
Virtual Visit via Telephone Note Due to national recommendations of social distancing due to [place] 19, telehealth visit is felt to be most appropriate for this patient at this time.  I discussed the limitations, risks, security and privacy concerns of performing an evaluation and management service by telephone and the availability of in person appointments. I also discussed with the patient that there may be a patient responsible charge related to this service. The patient expressed understanding and agreed to proceed.    I connected with [name] on 08/21/21  at   3:50 PM EST  EDT by telephone and verified that I am speaking with the correct person using two identifiers.  Location of Patient: Private Residence   Location of Provider: [place] and [employer] Office    Persons participating in Telemedicine visit: Geryl Rankins FNP-BC NELL[facility] CHEVALIER    History of Present Illness: Telemedicine visit for: DM He has a past medical history of Anxiety, Arthritis, Colon polyps (01/04/2020), DM2 (03/28/2019), Diverticulosis, GERD, Hyperlipidemia, and Hypertension.   DM 2 Reports blood glucose levels 90-130s. Administering lantus 40 units daily and farxiga 10 mg daily as prescribed. Diabetes is not well controlled but he does endorse a past history of medication and dietary non adherence. Hyperglycemic symptoms include diabetic peripheral neuropathy for which he takes gabapentin as needed.  Lab Results  Component Value Date   HGBA1C 9.4 (A) 05/20/2021     Fatigue: Patient complains of fatigue. Symptoms began several weeks ago. [place] symptom the patient feels fatigue began with: none. Symptoms of his fatigue have been decreased libido and fatigue with paradoxical insomnia. Patient describes the following psychologic symptoms:  stress Patient denies significant change in weight, exercise intolerance, cold intolerance, constipation and change in hair texture., and GI blood loss.  Symptoms have progressed to a point and plateaued. Severity has been mild. Previous visits for this problem: none.   Low energy. Testosterone check.      Past Medical History:  Diagnosis Date   Anxiety    Arthritis    Colon polyps 01/04/2020   Removal of multiple colon polyps during colonoscopy 12/2019- Dr. Thornton Park   Depression    Diabetes mellitus without complication ([place]) 72/90/2111   Diverticulosis    GERD (gastroesophageal reflux disease)    Hyperlipidemia    Hypertension     Past Surgical History:  Procedure Laterality Date   BONY PELVIS SURGERY     HUMERUS FRACTURE SURGERY Right 2009    Family History  Problem Relation Age of Onset   Hypertension Mother    Diabetes Mother    Lung cancer Father    Cancer Brother        in his sinus's area and head   Diabetes Paternal Grandfather    Colon polyps Neg Hx    Esophageal cancer Neg Hx    Rectal cancer Neg Hx    Stomach cancer Neg Hx     Social History   Socioeconomic History   Marital status: Married    Spouse name: Not on file   Number of children: 1   Years of education: Not on file   Highest education level: Not on file  Occupational History   Occupation: unemployed  Tobacco Use   Smoking status: Former   Smokeless tobacco: Never  Scientific laboratory technician Use: Never used  Substance and Sexual Activity   Alcohol use: Yes    Alcohol/week: 0.0 standard drinks    Comment: rarely   Drug use:  Yes    Types: Marijuana    Comment: last use 2-3 days ago   Sexual activity: Yes  Other Topics Concern   Not on file  Social History Narrative   Not on file   Social Determinants of Health   Financial Resource Strain: Not on file  Food Insecurity: Not on file  Transportation Needs: Not on file  Physical Activity: Not on file  Stress: Not on file  Social Connections: Not on file     Observations/Objective: Awake, alert and oriented x 3   Review of Systems  Constitutional:  Positive for  malaise/fatigue. Negative for fever and weight loss.  HENT: Negative.  Negative for nosebleeds.   Eyes: Negative.  Negative for blurred vision, double vision and photophobia.  Respiratory: Negative.  Negative for cough and shortness of breath.   Cardiovascular: Negative.  Negative for chest pain, palpitations and leg swelling.  Gastrointestinal: Negative.  Negative for heartburn, nausea and vomiting.  Musculoskeletal: Negative.  Negative for myalgias.  Neurological: Negative.  Negative for dizziness, focal weakness, seizures and headaches.  Psychiatric/Behavioral:  Positive for depression. Negative for suicidal ideas. The patient is nervous/anxious.    Assessment and Plan: Diagnoses and all orders for this visit:  Type 2 diabetes mellitus with hyperglycemia, with long-term current use of insulin ([place]) -     Hemoglobin A1c; Future -     CMP14+EGFR; Future -     Thyroid Panel With TSH; Future  Chronic fatigue -     Testosterone; Future -     Thyroid Panel With TSH; Future  Libido, decreased -     Testosterone; Future -     Thyroid Panel With TSH; Future  Dyslipidemia, goal LDL below 70 -     Lipid Panel; Future  Need for hepatitis C screening test -     HCV Ab w Reflex to Quant PCR; Future  Colon cancer screening -     Fecal occult blood, imunochemical([facility]); Future     Follow Up Instructions No follow-ups on file.     I discussed the assessment and treatment plan with the patient. The patient was provided an opportunity to ask questions and all were answered. The patient agreed with the plan and demonstrated an understanding of the instructions.   The patient was advised to call back or seek an in-person evaluation if the symptoms worsen or if the condition fails to improve as anticipated.  I provided 15 minutes of non-face-to-face time during this encounter including median intraservice time, reviewing previous notes, labs, imaging, medications and explaining  diagnosis and management.  Gildardo Pounds, FNP-BC

## 2021-09-07 ENCOUNTER — Other Ambulatory Visit: Payer: Self-pay

## 2021-10-23 ENCOUNTER — Ambulatory Visit: Payer: Medicaid Other | Admitting: Nurse Practitioner

## 2021-12-02 ENCOUNTER — Other Ambulatory Visit: Payer: Self-pay

## 2021-12-22 ENCOUNTER — Other Ambulatory Visit: Payer: Self-pay | Admitting: Pharmacist

## 2021-12-22 ENCOUNTER — Other Ambulatory Visit: Payer: Self-pay

## 2021-12-22 ENCOUNTER — Telehealth: Payer: Self-pay

## 2021-12-22 MED ORDER — INSULIN GLARGINE-YFGN 100 UNIT/ML ~~LOC~~ SOPN
40.0000 [IU] | PEN_INJECTOR | Freq: Every day | SUBCUTANEOUS | 2 refills | Status: DC
  Filled 2021-12-22: qty 15, 37d supply, fill #0

## 2021-12-22 NOTE — Telephone Encounter (Signed)
TRULICITY PA APPROVED UNTIL 12/21/22

## 2021-12-24 ENCOUNTER — Other Ambulatory Visit: Payer: Self-pay

## 2022-01-22 ENCOUNTER — Other Ambulatory Visit: Payer: Self-pay

## 2022-04-01 ENCOUNTER — Other Ambulatory Visit: Payer: Self-pay

## 2022-04-01 ENCOUNTER — Encounter: Payer: Self-pay | Admitting: Physician Assistant

## 2022-04-01 ENCOUNTER — Ambulatory Visit: Payer: BC Managed Care – PPO | Attending: Physician Assistant | Admitting: Physician Assistant

## 2022-04-01 VITALS — BP 149/76 | HR 77 | Ht 73.0 in | Wt 271.0 lb

## 2022-04-01 DIAGNOSIS — F32A Depression, unspecified: Secondary | ICD-10-CM

## 2022-04-01 DIAGNOSIS — I10 Essential (primary) hypertension: Secondary | ICD-10-CM | POA: Diagnosis not present

## 2022-04-01 DIAGNOSIS — E118 Type 2 diabetes mellitus with unspecified complications: Secondary | ICD-10-CM

## 2022-04-01 DIAGNOSIS — E1165 Type 2 diabetes mellitus with hyperglycemia: Secondary | ICD-10-CM

## 2022-04-01 DIAGNOSIS — E1169 Type 2 diabetes mellitus with other specified complication: Secondary | ICD-10-CM

## 2022-04-01 DIAGNOSIS — F419 Anxiety disorder, unspecified: Secondary | ICD-10-CM

## 2022-04-01 DIAGNOSIS — J449 Chronic obstructive pulmonary disease, unspecified: Secondary | ICD-10-CM

## 2022-04-01 DIAGNOSIS — Z91199 Patient's noncompliance with other medical treatment and regimen due to unspecified reason: Secondary | ICD-10-CM

## 2022-04-01 DIAGNOSIS — Z794 Long term (current) use of insulin: Secondary | ICD-10-CM

## 2022-04-01 DIAGNOSIS — E785 Hyperlipidemia, unspecified: Secondary | ICD-10-CM

## 2022-04-01 DIAGNOSIS — G8929 Other chronic pain: Secondary | ICD-10-CM

## 2022-04-01 DIAGNOSIS — M545 Low back pain, unspecified: Secondary | ICD-10-CM

## 2022-04-01 LAB — POCT GLYCOSYLATED HEMOGLOBIN (HGB A1C): HbA1c, POC (controlled diabetic range): 11.7 % — AB (ref 0.0–7.0)

## 2022-04-01 LAB — GLUCOSE, POCT (MANUAL RESULT ENTRY): POC Glucose: 271 mg/dl — AB (ref 70–99)

## 2022-04-01 MED ORDER — ALBUTEROL SULFATE HFA 108 (90 BASE) MCG/ACT IN AERS
2.0000 | INHALATION_SPRAY | Freq: Four times a day (QID) | RESPIRATORY_TRACT | 1 refills | Status: AC | PRN
  Filled 2022-04-01: qty 18, 25d supply, fill #0
  Filled 2022-04-27: qty 18, 25d supply, fill #1

## 2022-04-01 MED ORDER — LISINOPRIL 40 MG PO TABS
40.0000 mg | ORAL_TABLET | Freq: Every day | ORAL | 1 refills | Status: AC
  Filled 2022-04-01: qty 30, 30d supply, fill #0
  Filled 2022-04-27 (×2): qty 30, 30d supply, fill #1
  Filled 2022-06-06 – 2022-06-24 (×2): qty 30, 30d supply, fill #2

## 2022-04-01 MED ORDER — TRUE METRIX BLOOD GLUCOSE TEST VI STRP
ORAL_STRIP | 5 refills | Status: AC
  Filled 2022-04-01: qty 100, 90d supply, fill #0

## 2022-04-01 MED ORDER — TRUEPLUS LANCETS 28G MISC
6 refills | Status: AC
  Filled 2022-04-01: qty 100, 90d supply, fill #0
  Filled 2022-04-27: qty 100, 90d supply, fill #1

## 2022-04-01 MED ORDER — INSULIN GLARGINE-YFGN 100 UNIT/ML ~~LOC~~ SOPN
30.0000 [IU] | PEN_INJECTOR | Freq: Every day | SUBCUTANEOUS | 2 refills | Status: AC
  Filled 2022-04-01: qty 15, 50d supply, fill #0
  Filled 2022-04-27 – 2022-06-06 (×3): qty 15, 50d supply, fill #1

## 2022-04-01 MED ORDER — GABAPENTIN 300 MG PO CAPS
300.0000 mg | ORAL_CAPSULE | Freq: Three times a day (TID) | ORAL | 3 refills | Status: AC | PRN
  Filled 2022-04-01: qty 90, 30d supply, fill #0
  Filled 2022-04-27: qty 90, 30d supply, fill #1
  Filled 2022-06-06 – 2022-06-24 (×2): qty 90, 30d supply, fill #2

## 2022-04-01 MED ORDER — TRULICITY 0.75 MG/0.5ML ~~LOC~~ SOAJ
0.7500 mg | SUBCUTANEOUS | 1 refills | Status: AC
  Filled 2022-04-01: qty 2, 28d supply, fill #0
  Filled 2022-04-27 (×2): qty 2, 28d supply, fill #1
  Filled 2022-06-06: qty 2, 28d supply, fill #2

## 2022-04-01 MED ORDER — CITALOPRAM HYDROBROMIDE 20 MG PO TABS
20.0000 mg | ORAL_TABLET | Freq: Every day | ORAL | 3 refills | Status: AC
  Filled 2022-04-01: qty 30, 30d supply, fill #0
  Filled 2022-04-27: qty 30, 30d supply, fill #1
  Filled 2022-06-06 – 2022-06-24 (×2): qty 30, 30d supply, fill #2

## 2022-04-01 MED ORDER — ATORVASTATIN CALCIUM 20 MG PO TABS
20.0000 mg | ORAL_TABLET | Freq: Every day | ORAL | 1 refills | Status: AC
  Filled 2022-04-01: qty 30, 30d supply, fill #0
  Filled 2022-04-27: qty 30, 30d supply, fill #1
  Filled 2022-06-06 – 2022-06-24 (×2): qty 30, 30d supply, fill #2

## 2022-04-01 MED ORDER — PEN NEEDLES 31G X 5 MM MISC
1.0000 | Freq: Every day | 1 refills | Status: AC
  Filled 2022-04-01: qty 100, 25d supply, fill #0
  Filled 2022-04-22: qty 100, 25d supply, fill #1

## 2022-04-01 MED ORDER — METHOCARBAMOL 500 MG PO TABS
1000.0000 mg | ORAL_TABLET | Freq: Three times a day (TID) | ORAL | 0 refills | Status: AC | PRN
  Filled 2022-04-01: qty 90, 15d supply, fill #0

## 2022-04-01 NOTE — Progress Notes (Signed)
Patient ID: Mark Ferrell, male   DOB: 11/25/1970, 51 y.o.   MRN: [id number]   Mark Ferrell, is a 51 y.o. male  CSN:[id number]  MRN:[id number]  DOB - 12/31/1970  Chief Complaint  Patient presents with   Medication Reaction    Stomach Upset when taking medication        Subjective:   Mark Ferrell is a 51 y.o. male here today formed RF.  After a lengthy discussion, it basically becomes clear he has not taken any meds in at least a few months.  He says he is NOT willing to take metformin, farxiga.  He will take an insulin that helps him lose weight(trulicity helped previously).  He is also willing to go back on semglee.  He has not been checking blood sugars.  He buys pain meds off the streets at times.  He has not been seen in almost 1 year and has not kept follow up appts  No problems updated.  ALLERGIES: Allergies  Allergen Reactions   Penicillins Swelling    PAST MEDICAL HISTORY: Past Medical History:  Diagnosis Date   Anxiety    Arthritis    Colon polyps 01/04/2020   Removal of multiple colon polyps during colonoscopy 12/2019- Dr. Kimberly Beavers   Depression    Diabetes mellitus without complication (HCC) 03/28/2019   Diverticulosis    GERD (gastroesophageal reflux disease)    Hyperlipidemia    Hypertension     MEDICATIONS AT HOME: Prior to Admission medications   Medication Sig Start Date End Date Taking? Authorizing Provider  blood glucose meter kit and supplies KIT Dispense based on patient and insurance preference. Use up to four times daily as directed. (FOR ICD-9 250.00, 250.01). 04/02/19  Yes Dykstra, Richard S, MD  Blood Glucose Monitoring Suppl (TRUE METRIX METER) w/Device KIT Use as directed to check blood sugar daily. 02/16/21  Yes Fleming, Zelda W, NP  citalopram (CELEXA) 20 MG tablet Take 1 tablet (20 mg total) by mouth daily. 04/01/22  Yes ,  M, PA-C  clotrimazole-betamethasone (LOTRISONE) cream Apply 1 application topically daily. NEEDS PASS  02/16/21  Yes Fleming, Zelda W, NP  Dulaglutide (TRULICITY) 0.75 MG/0.5ML SOPN Inject 0.75 mg into the skin once a week. 04/01/22  Yes ,  M, PA-C  Insulin Pen Needle (PEN NEEDLES) 31G X 5 MM MISC 1 each by Does not apply route daily. 04/01/22  Yes ,  M, PA-C  albuterol (VENTOLIN HFA) 108 (90 Base) MCG/ACT inhaler INHALE 2 PUFFS INTO THE LUNGS EVERY 6 (SIX) HOURS AS NEEDED FOR WHEEZING OR SHORTNESS OF BREATH. 04/01/22 04/01/23  ,  M, PA-C  atorvastatin (LIPITOR) 20 MG tablet Take 1 tablet (20 mg total) by mouth daily. 04/01/22 06/30/22  ,  M, PA-C  gabapentin (NEURONTIN) 300 MG capsule Take 1 capsule (300 mg total) by mouth 3 (three) times daily as needed (pain). 04/01/22 06/30/22  ,  M, PA-C  glucose blood (TRUE METRIX BLOOD GLUCOSE TEST) test strip Use as instructed to check blood sugar daily. 04/01/22   ,  M, PA-C  insulin glargine-yfgn (SEMGLEE, YFGN,) 100 UNIT/ML Pen Inject 30 Units into the skin daily. 04/01/22   ,  M, PA-C  lisinopril (ZESTRIL) 40 MG tablet Take 1 tablet (40 mg total) by mouth daily. NEEDS PASS 04/01/22 06/30/22  ,  M, PA-C  methocarbamol (ROBAXIN) 500 MG tablet Take 2 tablets (1,000 mg total) by mouth every 8 (eight) hours as needed for muscle spasms. 04/01/22   ,  M, PA-C    naproxen (NAPROSYN) 500 MG tablet Take 1 tablet (500 mg total) by mouth 2 (two) times daily with a meal. Patient not taking: Reported on 04/01/2022 12/24/20   ,  M, PA-C  TRUEplus Lancets 28G MISC Use as instructed to test blood sugar daily. 04/01/22   ,  M, PA-C    ROS: Neg HEENT Neg resp Neg cardiac Neg GI Neg GU Neg psych Neg neuro  Objective:   Vitals:   04/01/22 1539  BP: (!) 149/76  Pulse: 77  SpO2: 95%  Weight: 271 lb (122.9 kg)  Height: 6' 1" (1.854 m)   Exam General appearance : Awake, alert, not in any distress. Speech pressured. Not toxic looking HEENT: Atraumatic  and Normocephalic Neck: Supple, no JVD. No cervical lymphadenopathy.  Chest: Good air entry bilaterally, CTAB.  No rales/rhonchi/wheezing CVS: S1 S2 regular, no murmurs.  Extremities: B/L Lower Ext shows no edema, both legs are warm to touch Neurology: Awake alert, and oriented X 3, CN II-XII intact, Non focal Skin: No Rash  Data Review Lab Results  Component Value Date   HGBA1C 11.7 (A) 04/01/2022   HGBA1C 9.4 (A) 05/20/2021   HGBA1C 10.8 (A) 02/16/2021    Assessment & Plan   1. Type 2 diabetes mellitus with hyperglycemia, with long-term current use of insulin (HCC) Resume trulicity at starting dose and resume semglee at 30 units instead of 40 units since he hasn't been taking anything and I don't want to drop him too quickly.  Check blood sugars fasting and bedtime and record and bring to next visit - Glucose (CBG) - HgB A1c - insulin glargine-yfgn (SEMGLEE, YFGN,) 100 UNIT/ML Pen; Inject 30 Units into the skin daily.  Dispense: 15 mL; Refill: 2 - Dulaglutide (TRULICITY) 0.75 MG/0.5ML SOPN; Inject 0.75 mg into the skin once a week.  Dispense: 3 mL; Refill: 1 - TRUEplus Lancets 28G MISC; Use as instructed to test blood sugar daily.  Dispense: 100 each; Refill: 6 - glucose blood (TRUE METRIX BLOOD GLUCOSE TEST) test strip; Use as instructed to check blood sugar daily.  Dispense: 100 each; Refill: 5 - Insulin Pen Needle (PEN NEEDLES) 31G X 5 MM MISC; 1 each by Does not apply route daily.  Dispense: 100 each; Refill: 1 - Comprehensive metabolic panel  2. Primary hypertension Resume medication - lisinopril (ZESTRIL) 40 MG tablet; Take 1 tablet (40 mg total) by mouth daily. NEEDS PASS  Dispense: 90 tablet; Refill: 1  3. Hyperlipidemia associated with type 2 diabetes mellitus (HCC) - atorvastatin (LIPITOR) 20 MG tablet; Take 1 tablet (20 mg total) by mouth daily.  Dispense: 90 tablet; Refill: 1 - Lipid panel  4. DM (diabetes mellitus), type 2 with complications (HCC) - insulin  glargine-yfgn (SEMGLEE, YFGN,) 100 UNIT/ML Pen; Inject 30 Units into the skin daily.  Dispense: 15 mL; Refill: 2 - Dulaglutide (TRULICITY) 0.75 MG/0.5ML SOPN; Inject 0.75 mg into the skin once a week.  Dispense: 3 mL; Refill: 1 - TRUEplus Lancets 28G MISC; Use as instructed to test blood sugar daily.  Dispense: 100 each; Refill: 6 - glucose blood (TRUE METRIX BLOOD GLUCOSE TEST) test strip; Use as instructed to check blood sugar daily.  Dispense: 100 each; Refill: 5  5. Chronic obstructive pulmonary disease, unspecified COPD type (HCC) - albuterol (VENTOLIN HFA) 108 (90 Base) MCG/ACT inhaler; INHALE 2 PUFFS INTO THE LUNGS EVERY 6 (SIX) HOURS AS NEEDED FOR WHEEZING OR SHORTNESS OF BREATH.  Dispense: 18 g; Refill: 1  6. Chronic low back pain without sciatica, unspecified   back pain laterality - gabapentin (NEURONTIN) 300 MG capsule; Take 1 capsule (300 mg total) by mouth 3 (three) times daily as needed (pain).  Dispense: 270 capsule; Refill: 3 - methocarbamol (ROBAXIN) 500 MG tablet; Take 2 tablets (1,000 mg total) by mouth every 8 (eight) hours as needed for muscle spasms.  Dispense: 90 tablet; Refill: 0   7. Anxiety and depression Celexa helped previously - citalopram (CELEXA) 20 MG tablet; Take 1 tablet (20 mg total) by mouth daily.  Dispense: 30 tablet; Refill: 3  9. Noncompliance Compliance imperative.  Discussed at [facility]th.  Face to face 40 mins on all of the above    Return for 1 month with Lurena Joiner for diabetes titration; 3 months with PCP.  The patient was given clear instructions to go to ER or return to medical center if symptoms don't improve, worsen or new problems develop. The patient verbalized understanding. The patient was told to call to get lab results if they haven't heard anything in the next week.      Freeman Caldron, PA-C [facility], [place]   04/01/2022, 4:52 PM

## 2022-04-01 NOTE — Patient Instructions (Signed)
Check blood sugars fasting and at bedtime

## 2022-04-02 ENCOUNTER — Other Ambulatory Visit: Payer: Self-pay

## 2022-04-02 LAB — LIPID PANEL
Chol/HDL Ratio: 6.3 ratio — ABNORMAL HIGH (ref 0.0–5.0)
Cholesterol, Total: 220 mg/dL — ABNORMAL HIGH (ref 100–199)
HDL: 35 mg/dL — ABNORMAL LOW (ref >39)
LDL Chol Calc (NIH): 159 mg/dL — ABNORMAL HIGH (ref 0–99)
Triglycerides: 140 mg/dL (ref 0–149)
VLDL Cholesterol Cal: 26 mg/dL (ref 5–40)

## 2022-04-02 LAB — COMPREHENSIVE METABOLIC PANEL
ALT: 32 IU/L (ref 0–44)
AST: 26 IU/L (ref 0–40)
Albumin/Globulin Ratio: 1.4 (ref 1.2–2.2)
Albumin: 4.6 g/dL (ref 4.1–5.1)
Alkaline Phosphatase: 86 IU/L (ref 44–121)
BUN/Creatinine Ratio: 20 (ref 9–20)
BUN: 16 mg/dL (ref 6–24)
Bilirubin Total: 0.8 mg/dL (ref 0.0–1.2)
CO2: 25 mmol/L (ref 20–29)
Calcium: 9.5 mg/dL (ref 8.7–10.2)
Chloride: 95 mmol/L — ABNORMAL LOW (ref 96–106)
Creatinine, Ser: 0.82 mg/dL (ref 0.76–1.27)
Globulin, Total: 3.3 g/dL (ref 1.5–4.5)
Glucose: 266 mg/dL — ABNORMAL HIGH (ref 70–99)
Potassium: 4.7 mmol/L (ref 3.5–5.2)
Sodium: 136 mmol/L (ref 134–144)
Total Protein: 7.9 g/dL (ref 6.0–8.5)
eGFR: 108 mL/min/{1.73_m2} (ref >59)

## 2022-04-16 ENCOUNTER — Other Ambulatory Visit: Payer: Self-pay

## 2022-04-22 ENCOUNTER — Other Ambulatory Visit (HOSPITAL_COMMUNITY): Payer: Self-pay

## 2022-04-27 ENCOUNTER — Other Ambulatory Visit: Payer: Self-pay

## 2022-04-27 ENCOUNTER — Other Ambulatory Visit (HOSPITAL_COMMUNITY): Payer: Self-pay

## 2022-04-29 ENCOUNTER — Other Ambulatory Visit (HOSPITAL_COMMUNITY): Payer: Self-pay

## 2022-04-30 ENCOUNTER — Other Ambulatory Visit (HOSPITAL_COMMUNITY): Payer: Self-pay

## 2022-05-03 ENCOUNTER — Other Ambulatory Visit: Payer: Self-pay

## 2022-05-03 ENCOUNTER — Other Ambulatory Visit (HOSPITAL_COMMUNITY): Payer: Self-pay

## 2022-05-07 ENCOUNTER — Ambulatory Visit: Payer: BC Managed Care – PPO | Admitting: Pharmacist

## 2022-06-07 ENCOUNTER — Other Ambulatory Visit (HOSPITAL_COMMUNITY): Payer: Self-pay

## 2022-06-10 ENCOUNTER — Other Ambulatory Visit (HOSPITAL_COMMUNITY): Payer: Self-pay

## 2022-06-10 ENCOUNTER — Encounter (HOSPITAL_COMMUNITY): Payer: Self-pay

## 2022-06-11 ENCOUNTER — Ambulatory Visit: Payer: BC Managed Care – PPO | Admitting: Pharmacist

## 2022-06-11 ENCOUNTER — Telehealth: Payer: Self-pay | Admitting: Nurse Practitioner

## 2022-06-11 NOTE — Telephone Encounter (Signed)
If he is being seen by Zelda on 12/8 he does not have to be seen by me. I will be available for her to ask questions about therapy that day if needed.

## 2022-06-11 NOTE — Telephone Encounter (Signed)
Pt has to cancel his appt for today b/c he is still out of town working.  Pt would like to know if you have time on Friday, Dec 8 to work him in.  He has appt w/ Zelda at 3:30.  Either before or after his appt.  Pt states it is so hard lately for his to get off, they are working so much overtime. Pt states whatever you decide is fine, just call and OK to LM.   The next Friday you have availability is 07/23/22. Friday is only day he has off. (Supposed to anyway!!)

## 2022-06-18 ENCOUNTER — Other Ambulatory Visit (HOSPITAL_COMMUNITY): Payer: Self-pay

## 2022-06-24 ENCOUNTER — Other Ambulatory Visit (HOSPITAL_COMMUNITY): Payer: Self-pay

## 2022-06-25 ENCOUNTER — Other Ambulatory Visit (HOSPITAL_COMMUNITY): Payer: Self-pay

## 2022-07-02 ENCOUNTER — Ambulatory Visit: Payer: BC Managed Care – PPO | Admitting: Nurse Practitioner

## 2022-11-30 IMAGING — CR DG CHEST 2V
2 series · 2 of 2 positions shown · non-contrast
Comparison: 07/07/2015

CLINICAL DATA: Fall.

EXAM:
CHEST - 2 VIEW

[chest ap]
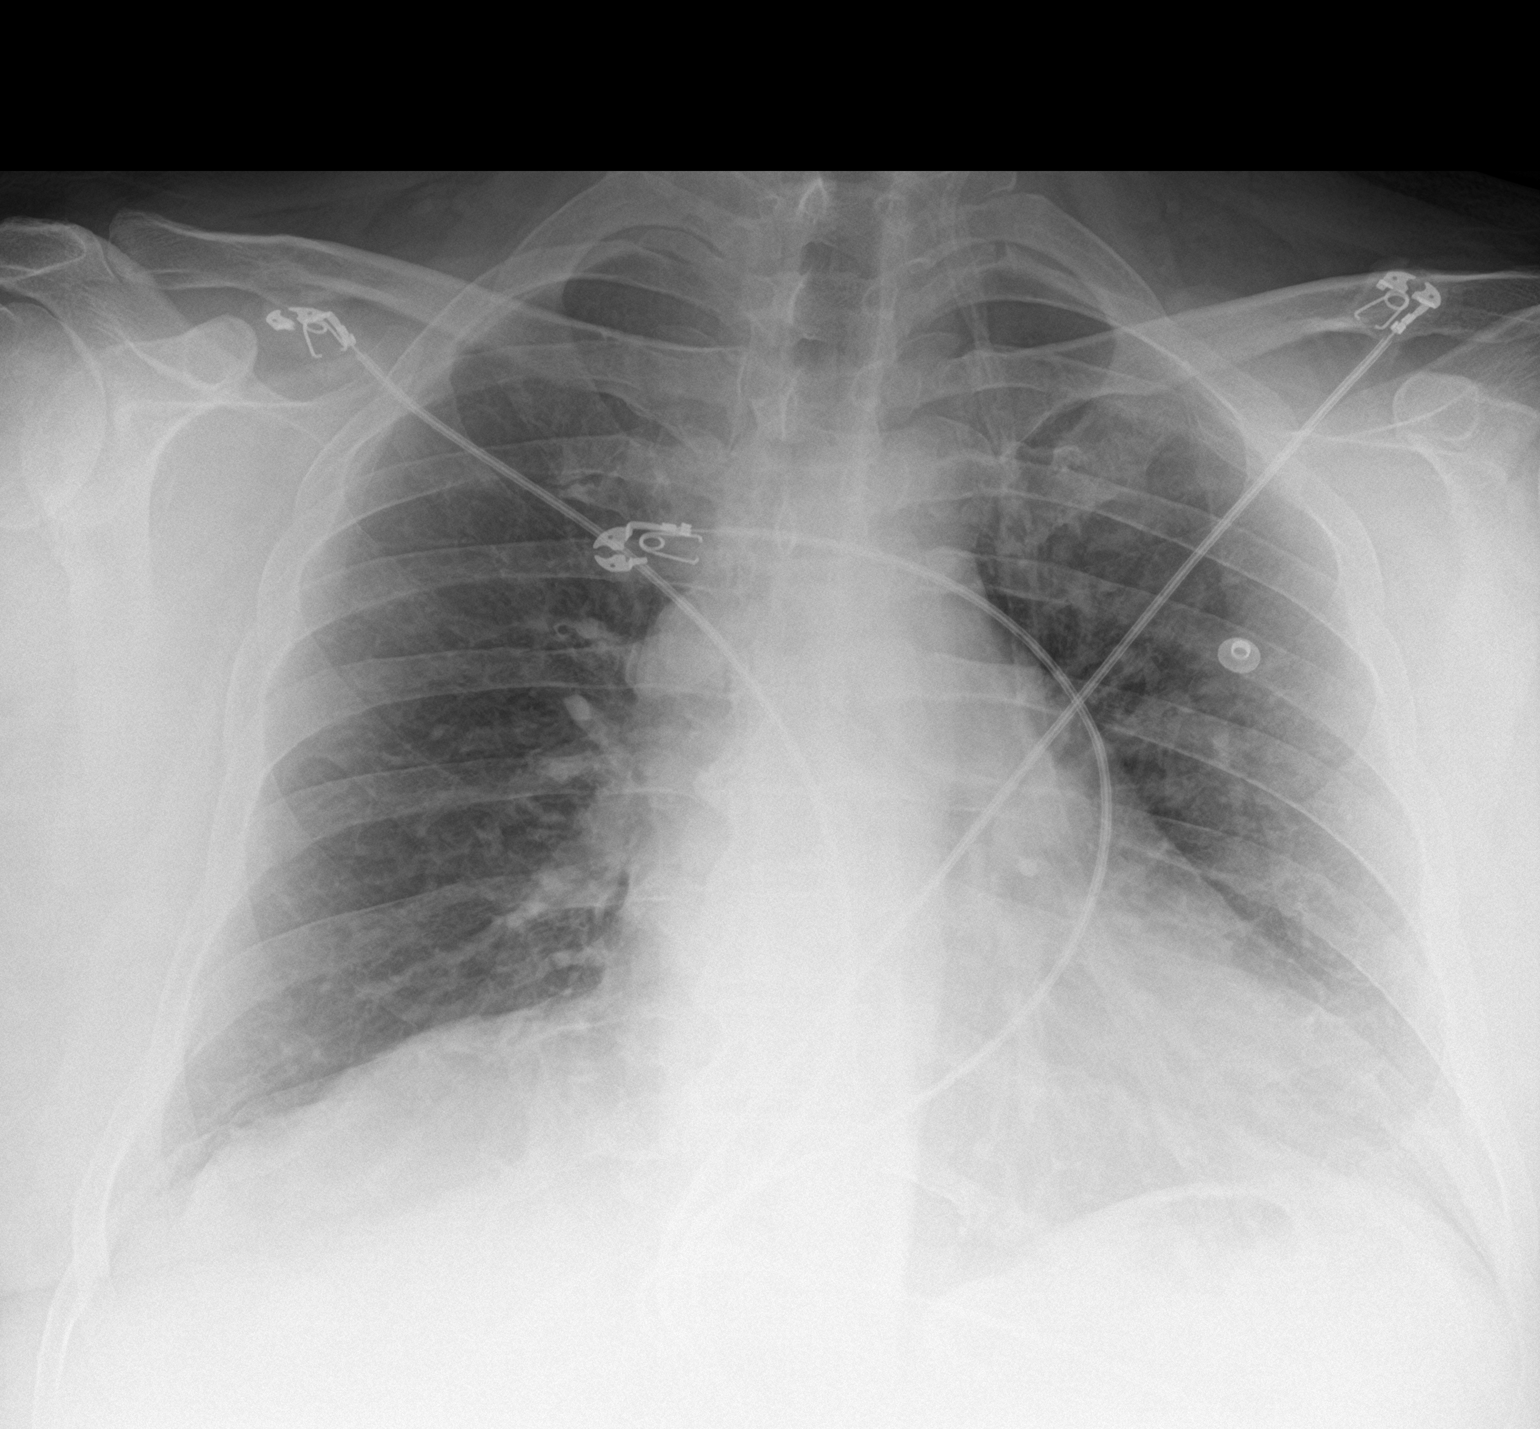

[chest lat]
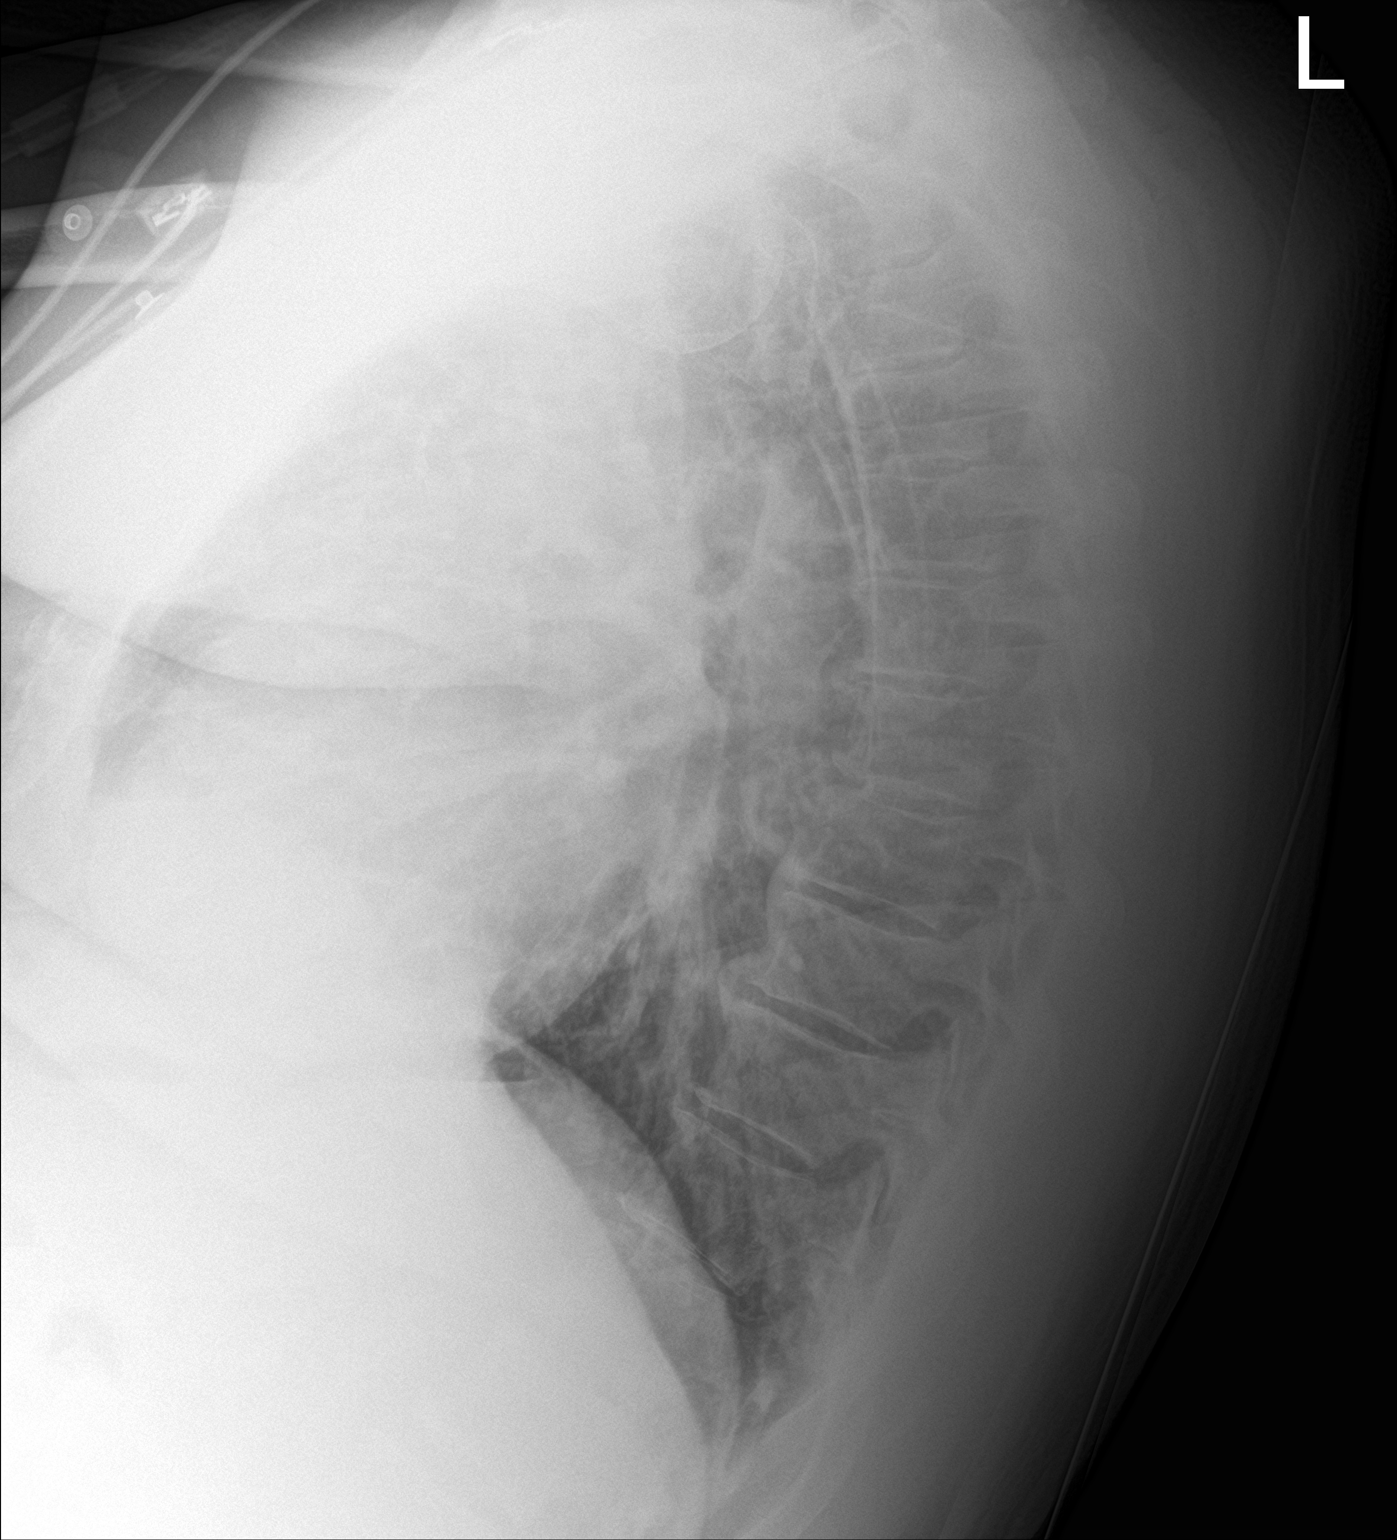

[2 of 2 positions shown; findings below may reference images not displayed]

FINDINGS: Low lung volumes. Upper normal heart size is likely accentuated by
technique. No focal airspace disease. No pleural fluid, pneumothorax
or pulmonary edema. No acute osseous abnormalities are seen.
IMPRESSION: Low lung volumes without acute abnormality. Upper normal heart size
is likely accentuated by technique.
